# Patient Record
Sex: Male | Born: 1995 | Race: White | Hispanic: No | Marital: Single | Smoking: Former smoker
Health system: Southern US, Community
[De-identification: ages and names within clinical notes are randomized; demographics above are authoritative.]

## PROBLEM LIST (undated history)

## (undated) DIAGNOSIS — F909 Attention-deficit hyperactivity disorder, unspecified type: Secondary | ICD-10-CM

## (undated) DIAGNOSIS — K219 Gastro-esophageal reflux disease without esophagitis: Secondary | ICD-10-CM

## (undated) HISTORY — PX: KNEE SURGERY: SHX244

## (undated) HISTORY — PX: ABDOMINAL SURGERY: SHX537

---
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
## 2013-02-21 ENCOUNTER — Emergency Department (HOSPITAL_COMMUNITY)
Admission: EM | Admit: 2013-02-21 | Discharge: 2013-02-21 | Disposition: A | Discharge status: Home / Self Care | Payer: Worker's Compensation | Attending: Emergency Medicine | Admitting: Emergency Medicine

## 2013-02-21 ENCOUNTER — Encounter (HOSPITAL_COMMUNITY): Payer: Self-pay | Admitting: Emergency Medicine

## 2013-02-21 DIAGNOSIS — W268XXA Contact with other sharp object(s), not elsewhere classified, initial encounter: Secondary | ICD-10-CM | POA: Insufficient documentation

## 2013-02-21 DIAGNOSIS — Z8659 Personal history of other mental and behavioral disorders: Secondary | ICD-10-CM | POA: Insufficient documentation

## 2013-02-21 DIAGNOSIS — Z23 Encounter for immunization: Secondary | ICD-10-CM | POA: Insufficient documentation

## 2013-02-21 DIAGNOSIS — Y93G1 Activity, food preparation and clean up: Secondary | ICD-10-CM | POA: Insufficient documentation

## 2013-02-21 DIAGNOSIS — S61219A Laceration without foreign body of unspecified finger without damage to nail, initial encounter: Secondary | ICD-10-CM

## 2013-02-21 DIAGNOSIS — Y990 Civilian activity done for income or pay: Secondary | ICD-10-CM | POA: Insufficient documentation

## 2013-02-21 DIAGNOSIS — S61209A Unspecified open wound of unspecified finger without damage to nail, initial encounter: Secondary | ICD-10-CM | POA: Insufficient documentation

## 2013-02-21 DIAGNOSIS — F17200 Nicotine dependence, unspecified, uncomplicated: Secondary | ICD-10-CM | POA: Insufficient documentation

## 2013-02-21 DIAGNOSIS — Y9229 Other specified public building as the place of occurrence of the external cause: Secondary | ICD-10-CM | POA: Insufficient documentation

## 2013-02-21 MED ORDER — TETANUS-DIPHTH-ACELL PERTUSSIS 5-2.5-18.5 LF-MCG/0.5 IM SUSP
0.5000 mL | Freq: Once | INTRAMUSCULAR | Status: AC
  Administered 2013-02-21: 0.5 mL via INTRAMUSCULAR
  Filled 2013-02-21: qty 0.5

## 2013-02-21 NOTE — Discharge Instructions (Signed)
Please read and follow all provided instructions.  Your diagnoses today include:  1. Finger laceration     Tests performed today include:  Vital signs. See below for your results today.   Medications prescribed:   None  Take any prescribed medications only as directed.   Home care instructions:  Follow any educational materials and wound care instructions contained in this packet.   Keep affected area above the level of your heart when possible to minimize swelling. Wash area gently twice a day with warm soapy water. Do not apply alcohol or hydrogen peroxide. Cover the area if it draining or weeping.   Follow-up instructions: Suture Removal: Return to the Emergency Department or see your primary care care doctor in 7 days for a recheck of your wound and removal of your sutures or staples.    If you do not have a primary care doctor -- see below for referral information.   Return instructions:  Return to the Emergency Department if you have:  Fever  Worsening pain  Worsening swelling of the wound  Pus draining from the wound  Redness of the skin that moves away from the wound, especially if it streaks away from the affected area   Any other emergent concerns  Your vital signs today were: BP 141/75   Pulse 68   Temp(Src) 98.4 F (36.9 C) (Oral)   Resp 18   SpO2 100% If your blood pressure (BP) was elevated above 135/85 this visit, please have this repeated by your doctor within one month. --------------

## 2013-02-21 NOTE — ED Provider Notes (Signed)
CSN: [id number]     Arrival date & time 02/21/13  1208 History  This chart was scribed for non-physician practitioner, Renne CriglerJoshua Joee Iovine, PA-C, working with Shelda JakesScott W. Zackowski, MD by Marica OtterNusrat Rahman, ED Scribe. This patient was seen in room TR09C/TR09C and the patient's care was started at 12:10 PM.  Chief Complaint  Patient presents with  . Laceration   The history is provided by the patient. No language interpreter was used.    HPI Comments: Omar Sheppard is a 18 y.o. male who presents to the Emergency Department complaining of a laceration to the right fifth finger onset 30-35 minutes ago. 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 Pt reports that his coworkers poured salt on his wound then he rinsed it with water and wrapped it in a towel to help control the bleeding. Pt further complains of a stinging sensation at the laceration site. There is no numbness or weakness in hand. He denies any other symptoms. Tetanus is not up to date. He is a current smoker.   Past Medical History  Diagnosis Date  . ADHD (attention deficit hyperactivity disorder)    Past Surgical History  Procedure Laterality Date  . Knee surgery    . Abdominal surgery      when infant   No family history on file. History  Substance Use Topics  . Smoking status: Current Some Day Smoker    Types: Cigars  . Smokeless tobacco: Not on file  . Alcohol Use: Yes     Comment: occasionally    Review of Systems  Gastrointestinal: Negative for nausea and vomiting.  Skin: Positive for wound (laceration on right hand, fifth digit).  Neurological: Negative for weakness and numbness.     Allergies  Review of patient's allergies indicates no known allergies.  Home Medications   Current Outpatient Rx  Name  Route  Sig  Dispense  Refill  . albuterol (PROVENTIL HFA;VENTOLIN HFA) 108 (90 BASE) MCG/ACT inhaler    Inhalation   Inhale 1-2 puffs into the lungs every 6 (six) hours as needed for wheezing.   1 Inhaler   0     Triage Vitals:   BP 141/75  Pulse 68  Temp(Src) 98.4 F (36.9 C) (Oral)  Resp 18  SpO2 100% Physical Exam  Nursing note and vitals reviewed. Constitutional: He appears well-developed and well-nourished. No distress.  HENT:  Head: Normocephalic and atraumatic.  Eyes: EOM are normal.  Neck: Neck supple. No tracheal deviation present.  Cardiovascular: Normal rate.   Pulses:      Radial pulses are 2+ on the right side.  Pulmonary/Chest: Effort normal. No respiratory distress.  Musculoskeletal: Normal range of motion.       Right hand: He exhibits laceration. He exhibits normal range of motion, normal capillary refill and no deformity. Normal sensation noted. Normal strength noted.  Normal capillary refill in right hand. Normal range of motion of right hand. 1 cm laceration to the ulnar aspect of the right fifth digit overlying the PIP joint. Upon exploration of wound, found to extend into subcutaneous tissue only and there is no tendon or nerve involvement visible. No FB seen or palpated.   Neurological: He is alert.  Motor, sensation, and vascular distal to injury fully intact.  Skin: Skin is warm and dry.  Psychiatric: He has a normal mood and affect. His behavior is normal.  ED Course  Procedures (including critical care time) DIAGNOSTIC STUDIES: Oxygen Saturation is 100% on room air, normal by my interpretation.    COORDINATION OF CARE:  12:11 PM-Discussed treatment plan and patient verbalizes understanding and agrees.  12:21 PM- Performed suture repair to laceration. LACERATION REPAIR PROCEDURE NOTE The patient's identification was confirmed and consent was obtained. This procedure was performed by Renne Crigler, PA-C at 12:21 PM. Site: right fifth digit Sterile procedures observed Anesthetic used (type and amt): 1% lidocaine wo epi, 1 cc Suture type/size:  6-0 ethilon Length: 1 cm # of Sutures: 4 Technique: Simple, interrupted Complexity: Simple Antibx ointment applied Tetanus ordered Site anesthetized, irrigated with NS, explored without evidence of foreign body, wound well approximated, site covered with dry, sterile dressing.  Patient tolerated procedure well without complications. Instructions for care discussed verbally and patient provided with additional written instructions for homecare and f/u.  Vital signs reviewed and are as follows: Filed Vitals:   02/21/13 1211  BP: 141/75  Pulse: 68  Temp: 98.4 F (36.9 C)  Resp: 18    Patient counseled on wound care. Patient counseled on need to return or see PCP/urgent care for suture removal in 7 days. Patient was urged to return to the Emergency Department urgently with worsening pain, swelling, expanding erythema especially if it streaks away from the affected area, fever, or if they have any other concerns. Patient verbalized understanding.    MDM   Final diagnoses:  Finger laceration   Finger lac, superficial, repaired without complication. Do not suspect bony, tendon, vascular involvement. Full ROM of finger.   I personally performed the services described in this documentation, which was scribed in my presence. The recorded information has been reviewed and is accurate.     Renne Crigler, PA-C 02/21/13 1304

## 2013-02-21 NOTE — ED Notes (Signed)
Pt presents with laceration to R 5th finger - reports wire mesh strand cut finger at work.

## 2013-02-25 NOTE — ED Provider Notes (Signed)
Medical screening examination/treatment/procedure(s) were performed by non-physician practitioner and as supervising physician I was immediately available for consultation/collaboration.  EKG Interpretation   None         Salma Walrond W. Yazeed Pryer, MD 02/25/13 0851 

## 2014-02-20 ENCOUNTER — Inpatient Hospital Stay (HOSPITAL_COMMUNITY)
Admission: EM | Admit: 2014-02-20 | Discharge: 2014-02-28 | DRG: 604 | Disposition: A | Discharge status: Home / Self Care | Payer: Medicaid Other | Attending: Internal Medicine | Admitting: Internal Medicine

## 2014-02-20 ENCOUNTER — Emergency Department (HOSPITAL_COMMUNITY): Payer: Medicaid Other

## 2014-02-20 ENCOUNTER — Encounter (HOSPITAL_COMMUNITY): Payer: Self-pay | Admitting: *Deleted

## 2014-02-20 DIAGNOSIS — K2960 Other gastritis without bleeding: Secondary | ICD-10-CM | POA: Diagnosis present

## 2014-02-20 DIAGNOSIS — R109 Unspecified abdominal pain: Secondary | ICD-10-CM | POA: Diagnosis present

## 2014-02-20 DIAGNOSIS — N170 Acute kidney failure with tubular necrosis: Secondary | ICD-10-CM | POA: Diagnosis present

## 2014-02-20 DIAGNOSIS — R112 Nausea with vomiting, unspecified: Secondary | ICD-10-CM | POA: Diagnosis present

## 2014-02-20 DIAGNOSIS — S301XXA Contusion of abdominal wall, initial encounter: Principal | ICD-10-CM | POA: Diagnosis present

## 2014-02-20 DIAGNOSIS — A419 Sepsis, unspecified organism: Secondary | ICD-10-CM | POA: Diagnosis present

## 2014-02-20 DIAGNOSIS — F17210 Nicotine dependence, cigarettes, uncomplicated: Secondary | ICD-10-CM | POA: Diagnosis present

## 2014-02-20 DIAGNOSIS — R1900 Intra-abdominal and pelvic swelling, mass and lump, unspecified site: Secondary | ICD-10-CM

## 2014-02-20 DIAGNOSIS — D62 Acute posthemorrhagic anemia: Secondary | ICD-10-CM | POA: Diagnosis not present

## 2014-02-20 DIAGNOSIS — R55 Syncope and collapse: Secondary | ICD-10-CM | POA: Diagnosis present

## 2014-02-20 DIAGNOSIS — K449 Diaphragmatic hernia without obstruction or gangrene: Secondary | ICD-10-CM | POA: Diagnosis present

## 2014-02-20 DIAGNOSIS — E869 Volume depletion, unspecified: Secondary | ICD-10-CM | POA: Diagnosis present

## 2014-02-20 DIAGNOSIS — X58XXXA Exposure to other specified factors, initial encounter: Secondary | ICD-10-CM | POA: Diagnosis present

## 2014-02-20 DIAGNOSIS — R001 Bradycardia, unspecified: Secondary | ICD-10-CM | POA: Diagnosis present

## 2014-02-20 DIAGNOSIS — R200 Anesthesia of skin: Secondary | ICD-10-CM

## 2014-02-20 DIAGNOSIS — Z23 Encounter for immunization: Secondary | ICD-10-CM

## 2014-02-20 DIAGNOSIS — K661 Hemoperitoneum: Secondary | ICD-10-CM | POA: Diagnosis present

## 2014-02-20 DIAGNOSIS — R195 Other fecal abnormalities: Secondary | ICD-10-CM

## 2014-02-20 DIAGNOSIS — R1010 Upper abdominal pain, unspecified: Secondary | ICD-10-CM | POA: Diagnosis present

## 2014-02-20 DIAGNOSIS — F909 Attention-deficit hyperactivity disorder, unspecified type: Secondary | ICD-10-CM | POA: Diagnosis present

## 2014-02-20 DIAGNOSIS — I639 Cerebral infarction, unspecified: Secondary | ICD-10-CM

## 2014-02-20 DIAGNOSIS — N179 Acute kidney failure, unspecified: Secondary | ICD-10-CM

## 2014-02-20 DIAGNOSIS — R1902 Left upper quadrant abdominal swelling, mass and lump: Secondary | ICD-10-CM | POA: Insufficient documentation

## 2014-02-20 DIAGNOSIS — D509 Iron deficiency anemia, unspecified: Secondary | ICD-10-CM | POA: Diagnosis present

## 2014-02-20 DIAGNOSIS — I509 Heart failure, unspecified: Secondary | ICD-10-CM

## 2014-02-20 DIAGNOSIS — K209 Esophagitis, unspecified: Secondary | ICD-10-CM | POA: Diagnosis present

## 2014-02-20 DIAGNOSIS — R188 Other ascites: Secondary | ICD-10-CM | POA: Diagnosis present

## 2014-02-20 DIAGNOSIS — E876 Hypokalemia: Secondary | ICD-10-CM | POA: Diagnosis present

## 2014-02-20 DIAGNOSIS — R202 Paresthesia of skin: Secondary | ICD-10-CM

## 2014-02-20 DIAGNOSIS — Z8249 Family history of ischemic heart disease and other diseases of the circulatory system: Secondary | ICD-10-CM | POA: Diagnosis not present

## 2014-02-20 LAB — COMPREHENSIVE METABOLIC PANEL
ALBUMIN: 4.4 g/dL (ref 3.5–5.2)
ALK PHOS: 97 U/L (ref 39–117)
ALT: 27 U/L (ref 0–53)
ANION GAP: 8 (ref 5–15)
AST: 27 U/L (ref 0–37)
BUN: 14 mg/dL (ref 6–23)
CO2: 24 mmol/L (ref 19–32)
Calcium: 9.3 mg/dL (ref 8.4–10.5)
Chloride: 104 mmol/L (ref 96–112)
Creatinine, Ser: 0.88 mg/dL (ref 0.50–1.35)
GFR calc Af Amer: >90 mL/min (ref >90)
GFR calc non Af Amer: >90 mL/min (ref >90)
Glucose, Bld: 167 mg/dL — ABNORMAL HIGH (ref 70–99)
POTASSIUM: 3.9 mmol/L (ref 3.5–5.1)
Sodium: 136 mmol/L (ref 135–145)
TOTAL PROTEIN: 7.1 g/dL (ref 6.0–8.3)
Total Bilirubin: 0.4 mg/dL (ref 0.3–1.2)

## 2014-02-20 LAB — URINALYSIS, ROUTINE W REFLEX MICROSCOPIC
Bilirubin Urine: NEGATIVE
GLUCOSE, UA: NEGATIVE mg/dL
Hgb urine dipstick: NEGATIVE
Ketones, ur: NEGATIVE mg/dL
LEUKOCYTES UA: NEGATIVE
Nitrite: NEGATIVE
Protein, ur: NEGATIVE mg/dL
Specific Gravity, Urine: >1.046 — ABNORMAL HIGH (ref 1.005–1.030)
UROBILINOGEN UA: 0.2 mg/dL (ref 0.0–1.0)
pH: 6.5 (ref 5.0–8.0)

## 2014-02-20 LAB — CBC
HCT: 32.9 % — ABNORMAL LOW (ref 39.0–52.0)
HEMOGLOBIN: 11.1 g/dL — AB (ref 13.0–17.0)
MCH: 30.5 pg (ref 26.0–34.0)
MCHC: 33.7 g/dL (ref 30.0–36.0)
MCV: 90.4 fL (ref 78.0–100.0)
Platelets: 248 10*3/uL (ref 150–400)
RBC: 3.64 MIL/uL — AB (ref 4.22–5.81)
RDW: 13.2 % (ref 11.5–15.5)
WBC: 19.4 10*3/uL — ABNORMAL HIGH (ref 4.0–10.5)

## 2014-02-20 LAB — CBC WITH DIFFERENTIAL/PLATELET
BASOS ABS: 0 10*3/uL (ref 0.0–0.1)
BASOS PCT: 0 % (ref 0–1)
EOS ABS: 0.2 10*3/uL (ref 0.0–0.7)
Eosinophils Relative: 1 % (ref 0–5)
HCT: 41.4 % (ref 39.0–52.0)
HEMOGLOBIN: 14.1 g/dL (ref 13.0–17.0)
Lymphocytes Relative: 12 % (ref 12–46)
Lymphs Abs: 2 10*3/uL (ref 0.7–4.0)
MCH: 31.1 pg (ref 26.0–34.0)
MCHC: 34.1 g/dL (ref 30.0–36.0)
MCV: 91.2 fL (ref 78.0–100.0)
MONOS PCT: 6 % (ref 3–12)
Monocytes Absolute: 1 10*3/uL (ref 0.1–1.0)
NEUTROS PCT: 81 % — AB (ref 43–77)
Neutro Abs: 13.3 10*3/uL — ABNORMAL HIGH (ref 1.7–7.7)
Platelets: 282 10*3/uL (ref 150–400)
RBC: 4.54 MIL/uL (ref 4.22–5.81)
RDW: 13.4 % (ref 11.5–15.5)
WBC: 16.5 10*3/uL — ABNORMAL HIGH (ref 4.0–10.5)

## 2014-02-20 LAB — RAPID URINE DRUG SCREEN, HOSP PERFORMED
Amphetamines: NOT DETECTED
BARBITURATES: NOT DETECTED
BENZODIAZEPINES: NOT DETECTED
Cocaine: NOT DETECTED
OPIATES: POSITIVE — AB
Tetrahydrocannabinol: POSITIVE — AB

## 2014-02-20 LAB — PROCALCITONIN

## 2014-02-20 LAB — LACTIC ACID, PLASMA: LACTIC ACID, VENOUS: 2.1 mmol/L — AB (ref 0.5–2.0)

## 2014-02-20 LAB — LIPASE, BLOOD: Lipase: 28 U/L (ref 11–59)

## 2014-02-20 LAB — MRSA PCR SCREENING: MRSA by PCR: NEGATIVE

## 2014-02-20 LAB — ETHANOL: Alcohol, Ethyl (B): <5 mg/dL (ref 0–9)

## 2014-02-20 LAB — BRAIN NATRIURETIC PEPTIDE: B Natriuretic Peptide: 31.5 pg/mL (ref 0.0–100.0)

## 2014-02-20 MED ORDER — SODIUM CHLORIDE 0.9 % IV BOLUS (SEPSIS)
1000.0000 mL | Freq: Once | INTRAVENOUS | Status: AC
  Administered 2014-02-20: 1000 mL via INTRAVENOUS

## 2014-02-20 MED ORDER — ACETAMINOPHEN 325 MG PO TABS
650.0000 mg | ORAL_TABLET | Freq: Four times a day (QID) | ORAL | Status: DC | PRN
  Administered 2014-02-22 – 2014-02-23 (×2): 650 mg via ORAL
  Filled 2014-02-20 (×3): qty 2

## 2014-02-20 MED ORDER — INFLUENZA VAC SPLIT QUAD 0.5 ML IM SUSY
0.5000 mL | PREFILLED_SYRINGE | INTRAMUSCULAR | Status: AC
  Administered 2014-02-21: 0.5 mL via INTRAMUSCULAR
  Filled 2014-02-20 (×2): qty 0.5

## 2014-02-20 MED ORDER — PIPERACILLIN-TAZOBACTAM 3.375 G IVPB
3.3750 g | Freq: Three times a day (TID) | INTRAVENOUS | Status: DC
  Administered 2014-02-21 – 2014-02-25 (×15): 3.375 g via INTRAVENOUS
  Filled 2014-02-20 (×15): qty 50

## 2014-02-20 MED ORDER — IOHEXOL 300 MG/ML  SOLN
100.0000 mL | Freq: Once | INTRAMUSCULAR | Status: AC | PRN
  Administered 2014-02-20: 100 mL via INTRAVENOUS

## 2014-02-20 MED ORDER — SODIUM CHLORIDE 0.9 % IV SOLN
INTRAVENOUS | Status: DC
Start: 2014-02-20 — End: 2014-02-20

## 2014-02-20 MED ORDER — HYDROMORPHONE HCL 1 MG/ML IJ SOLN
1.0000 mg | INTRAMUSCULAR | Status: DC | PRN
  Administered 2014-02-20 – 2014-02-21 (×4): 1 mg via INTRAVENOUS
  Filled 2014-02-20 (×4): qty 1

## 2014-02-20 MED ORDER — ONDANSETRON HCL 4 MG/2ML IJ SOLN
4.0000 mg | Freq: Four times a day (QID) | INTRAMUSCULAR | Status: DC | PRN
  Administered 2014-02-20 – 2014-02-24 (×6): 4 mg via INTRAVENOUS
  Filled 2014-02-20 (×7): qty 2

## 2014-02-20 MED ORDER — PIPERACILLIN-TAZOBACTAM 3.375 G IVPB 30 MIN
3.3750 g | INTRAVENOUS | Status: AC
  Administered 2014-02-20: 3.375 g via INTRAVENOUS
  Filled 2014-02-20: qty 50

## 2014-02-20 MED ORDER — ONDANSETRON HCL 4 MG/2ML IJ SOLN
4.0000 mg | Freq: Once | INTRAMUSCULAR | Status: AC
  Administered 2014-02-20: 4 mg via INTRAVENOUS
  Filled 2014-02-20: qty 2

## 2014-02-20 MED ORDER — ACETAMINOPHEN 650 MG RE SUPP: 650.0000 mg | Freq: Four times a day (QID) | RECTAL | Status: DC | PRN

## 2014-02-20 MED ORDER — MORPHINE SULFATE 2 MG/ML IJ SOLN
1.0000 mg | INTRAMUSCULAR | Status: DC | PRN
  Administered 2014-02-20 – 2014-02-21 (×2): 1 mg via INTRAVENOUS
  Filled 2014-02-20 (×2): qty 1

## 2014-02-20 MED ORDER — ONDANSETRON HCL 4 MG PO TABS: 4.0000 mg | ORAL_TABLET | Freq: Four times a day (QID) | ORAL | Status: DC | PRN

## 2014-02-20 MED ORDER — VANCOMYCIN HCL IN DEXTROSE 1-5 GM/200ML-% IV SOLN
1000.0000 mg | INTRAVENOUS | Status: AC
  Administered 2014-02-20: 1000 mg via INTRAVENOUS
  Filled 2014-02-20: qty 200

## 2014-02-20 MED ORDER — SODIUM CHLORIDE 0.9 % IV SOLN
INTRAVENOUS | Status: AC
  Administered 2014-02-20 (×2): via INTRAVENOUS

## 2014-02-20 MED ORDER — IOHEXOL 300 MG/ML  SOLN
50.0000 mL | Freq: Once | INTRAMUSCULAR | Status: AC | PRN
  Administered 2014-02-20: 50 mL via ORAL

## 2014-02-20 MED ORDER — VANCOMYCIN HCL IN DEXTROSE 1-5 GM/200ML-% IV SOLN
1000.0000 mg | Freq: Three times a day (TID) | INTRAVENOUS | Status: DC
  Administered 2014-02-21 – 2014-02-23 (×7): 1000 mg via INTRAVENOUS
  Filled 2014-02-20 (×8): qty 200

## 2014-02-20 MED ORDER — HYDROMORPHONE HCL 1 MG/ML IJ SOLN
1.0000 mg | Freq: Once | INTRAMUSCULAR | Status: AC
Start: 2014-02-20 — End: 2014-02-20
  Administered 2014-02-20: 1 mg via INTRAVENOUS
  Filled 2014-02-20: qty 1

## 2014-02-20 NOTE — ED Notes (Signed)
Pt reports dry heaving since 0830. Pt reports when he was vomiting, he felt a "pop" in his chest and has been having pain since then, points to substernal area as location of pain. Denies diarrhea. Nausea not constant, only right before a dry heaving episode.

## 2014-02-20 NOTE — ED Notes (Signed)
Pt reports feeling much better. Denies nausea. Pain 5/10. No longer diaphoretic, pale, lethargic. Family at bedside. Awaiting surgery consult.

## 2014-02-20 NOTE — ED Notes (Signed)
Pt reports a dry heaving episode this am, which he has been having for the last few months which he relates to his girlfriend being pregnant. Sts the main problem today is that he felt a pop in his chest when he was dryheaving that is now causing him severe pain. Friend out to desk demanding staff in room stating pt was passing out. Pt noted to be sitting in chair beside bed, alert and oriented but had to be instructed multiple times to move to the bed. Pt never observed to actually pass out. Pt slightly diaphoretic lying in bed.

## 2014-02-20 NOTE — ED Notes (Signed)
[facility]. Matt, RN transporting pt to [place].

## 2014-02-20 NOTE — ED Notes (Signed)
Xray completed at bedside

## 2014-02-20 NOTE — Progress Notes (Signed)
ANTIBIOTIC CONSULT NOTE - INITIAL  Pharmacy Consult for Vancomycin and Zosyn Indication: rule out sepsis  No Known Allergies  Patient Measurements: Height: 5\' 6"  (167.6 cm) Weight: 170 lb (77.111 kg) IBW/kg (Calculated) : 63.8  Vital Signs: Temp: 97.5 F (36.4 C) (02/19 1420) Temp Source: Oral (02/19 1420) BP: 129/57 mmHg (02/19 1726) Pulse Rate: 98 (02/19 1726) Intake/Output from previous day:   Intake/Output from this shift:    Labs:  Recent Labs  02/20/14 1049  WBC 16.5*  HGB 14.1  PLT 282  CREATININE 0.88   Estimated Creatinine Clearance: 130.9 mL/min (by C-G formula based on Cr of 0.88). No results for input(s): VANCOTROUGH, VANCOPEAK, VANCORANDOM, GENTTROUGH, GENTPEAK, GENTRANDOM, TOBRATROUGH, TOBRAPEAK, TOBRARND, AMIKACINPEAK, AMIKACINTROU, AMIKACIN in the last 72 hours.   Microbiology: No results found for this or any previous visit (from the past 720 hour(s)).  Medical History: Past Medical History  Diagnosis Date  . ADHD (attention deficit hyperactivity disorder)     Medications:  Scheduled:   Infusions:  . sodium chloride    . piperacillin-tazobactam    . [START ON 02/21/2014] piperacillin-tazobactam (ZOSYN)  IV    . vancomycin    . [START ON 02/21/2014] vancomycin     PRN: acetaminophen **OR** acetaminophen, morphine injection, ondansetron **OR** ondansetron (ZOFRAN) IV  Assessment: 19 yo male presents with dry heaves, epigastric and lower chest pain, and syncopal episode in the ED. Abdominal CT reveals a 15 cm abdominal mass of uncertain etiology. [facility] MD has consulted Pharmacy to dose vancomycin and zosyn for sepsis as patient with hypotermia, hypotension, tachycardia and tachypnea .   Goal of Therapy:  Vancomycin trough level 15-20 mcg/ml  Zosyn dose per indication/renal function  Plan:   Vancomycin 1g IV q8h Check trough at steady state Zosyn 3.375gm IV q8h (4hr extended infusions) Follow up renal function & cultures, clinical  course  Loralee PacasErin Timiya Howells, PharmD, BCPS Pager: [phone] 02/20/2014,5:34 PM

## 2014-02-20 NOTE — ED Notes (Signed)
Pt assigned to 5E med-surg bed. Pt having bradycardic episodes and syncope. Elisabeth Pigeonevine, MD made aware and bed assignment changed to stepdown.

## 2014-02-20 NOTE — ED Provider Notes (Signed)
Date: 02/20/2014  Rate: 90  Rhythm: normal sinus rhythm  QRS Axis: normal  Intervals: normal  ST/T Wave abnormalities: normal  Conduction Disutrbances: none  Narrative Interpretation: unremarkable  I did not evaluate this patient and had no interaction with him     Doug SouSam Jaquel Coomer, MD 02/20/14 1812

## 2014-02-20 NOTE — H&P (Addendum)
[facility] History and Physical  Omar Sheppard [facility] BWG:[id number] DOB: 05/13/1995 DOA: 02/20/2014  Referring physician: ER physician PCP: [name], MD   Chief Complaint: abdominal pain, nausea   HPI:  19 y.o. Male with no significant past medical history who presented to [facility] ED with worsening sharp, constant abdominal pain in upper abdomen and epigastric area started on the day prior to this admission associated with nausea, 8/10 in intensity and occasionally radiating to chest area. Pt reported no palpitations but feels short of breath if he lays down. No reports of vomiting. No reports of diarrhea or constipation. No blood per rectum. No syncope, no loss of consciousness. No fevers or chills. No vision changes. No urinary complaints.   In ED, BP was 87/61, HR 49-102, RR 27, T 95.2 F and oxygen saturation 99% on room air. Blood work showed WBC count 16.5 otherwise unremarkable. CT abdomen showed 15.3 cm soft tissue lesion in the left upper abdomen, worrisome for tumor such as lymphoma or possibly sarcoma. Surgery has seen the pt in consultation. In addition, pt had a brief episode of bradycardia and syncope in ED and order placed for SDU admission. Since vitals signs and blood work suggestive of sepsis pt was started on broad spectrum abx.   Assessment & Plan    Principal Problem:   Abdominal mass, nausea and abdominal pain - CT abdomen showed 15.3 cm soft tissue lesion in the left upper abdomen, worrisome for tumor such as lymphoma or possibly sarcoma. - order placed for IR consult for possible percutaneous biopsy - continue supportive care with IV fluids, analgesia and antiemetics as needed  - surgery consulted as well  Active Problems:   Syncope / Bradycardia - unclear etiology; obtain UDS, ethanol level - obtain 12 lead EKG, cycle cardiac enzymes  - monitor in SDU - may need cardio consult depending on 12 lead EKG shows, if patient continues to have  symptomatic bradycardia or if elevated trop levels    Sepsis - sepsis criteria met on admission with hypotermia, hypotension (87/61), tachycardia and tachypnea. In addition, pt has leukocytosis. CXR did not show evidence of infection. UA not ordered in ED so will obtain UA and urine culture, procalcitonin and lactic acid. Follow up blood culture results. - started empiric vanco and zosyn, will narrow down or discontinue as soon as above results are back  - admission to SDU - continue IV fluids    DVT prophylaxis:  - SCD's bilaterally    Radiological Exams on Admission: Ct Abdomen Pelvis W Contrast 02/20/2014 15.3 cm soft tissue lesion in the left upper abdomen, worrisome for tumor such as lymphoma or possibly sarcoma. Hematoma is considered less likely in the absence of trauma.  Associated moderate complex/ high density abdominopelvic ascites. No frank peritoneal nodularity.  No free air per  Given this patient has a history of significant trauma, consider surgical consultation. If biopsy is desired, this is likely amenable via endoscopic ultrasound or percutaneously.  These results were called by telephone at the time of interpretation on 02/20/2014 at 2:20 pm to Dr. Quintella Reichert , who verbally acknowledged these results.   Electronically Signed   By: Julian Hy M.D.   On: 02/20/2014 14:26   Dg Chest Port 1 View 02/20/2014  No edema or consolidation.   Electronically Signed   By: Lowella Grip III M.D.   On: 02/20/2014 11:11    EKG: pending   Code Status: Full Family Communication: Plan of care discussed with the patient  Disposition Plan: Admit for further evaluation. SDU due to hypotension, bradycardia, syncope   [name], MD  [facility] Pager [phone]  Review of Systems:  Constitutional: Negative for fever, chills and malaise/fatigue. Negative for diaphoresis.  HENT: Negative for hearing loss, ear pain, nosebleeds, congestion, sore throat, neck pain, tinnitus  and ear discharge.   Eyes: Negative for blurred vision, double vision, photophobia, pain, discharge and redness.  Respiratory: Negative for cough, hemoptysis, sputum production, shortness of breath, wheezing and stridor.   Cardiovascular: positive for chest pain, orthopnea, no claudication and no leg swelling.  Gastrointestinal: per HPI.  Genitourinary: Negative for dysuria, urgency, frequency, hematuria and flank pain.  Musculoskeletal: Negative for myalgias, back pain, joint pain and falls.  Skin: Negative for itching and rash.  Neurological: Negative for dizziness and weakness. Negative for tingling, tremors, sensory change, speech change, focal weakness, loss of consciousness and headaches.  Endo/Heme/Allergies: Negative for environmental allergies and polydipsia. Does not bruise/bleed easily.  Psychiatric/Behavioral: Negative for suicidal ideas. The patient is not nervous/anxious.      Past Medical History  Diagnosis Date  . ADHD (attention deficit hyperactivity disorder)    Past Surgical History  Procedure Laterality Date  . Knee surgery    . Abdominal surgery      when infant   Social History:  reports that he has been smoking Cigars.  He does not have any smokeless tobacco history on file. He reports that he drinks alcohol. He reports that he uses illicit drugs (Marijuana).  No Known Allergies  Family History: HTN in family    Prior to Admission medications   Not on File   Physical Exam: Filed Vitals:   02/20/14 1545 02/20/14 1600 02/20/14 1612 02/20/14 1630  BP: 113/72 115/63 93/35 130/69  Pulse: 101 102 77 80  Temp:      TempSrc:      Resp: 17 22 16 22   SpO2: 100% 100% 100% 100%    Physical Exam  Constitutional: Appears ill, pale. No distress.  HENT: Normocephalic. No tonsillar erythema or exudates Eyes: Conjunctivae are pale, EOM are normal. PERRLA, no scleral icterus.  Neck: Normal ROM. Neck supple. No JVD. No tracheal deviation. No thyromegaly.  CVS: RRR,  S1/S2 +, no murmurs, no gallops, no carotid bruit.  Pulmonary: Effort and breath sounds normal, no stridor, rhonchi, wheezes, rales.  Abdominal: Soft. BS +,  no distension, tenderness, rebound or guarding.  Musculoskeletal: Normal range of motion. No edema and no tenderness.  Lymphadenopathy: No lymphadenopathy noted, cervical, inguinal. Neuro: Alert. Normal reflexes, muscle tone coordination. No focal neurologic deficits. Skin: Skin is warm and dry. No rash noted.  No erythema. No pallor.  Psychiatric: Normal mood and affect. Behavior, judgment, thought content normal.   Labs on Admission:  Basic Metabolic Panel:  Recent Labs Lab 02/20/14 1049  NA 136  K 3.9  CL 104  CO2 24  GLUCOSE 167*  BUN 14  CREATININE 0.88  CALCIUM 9.3   Liver Function Tests:  Recent Labs Lab 02/20/14 1049  AST 27  ALT 27  ALKPHOS 97  BILITOT 0.4  PROT 7.1  ALBUMIN 4.4    Recent Labs Lab 02/20/14 1049  LIPASE 28   No results for input(s): AMMONIA in the last 168 hours. CBC:  Recent Labs Lab 02/20/14 1049  WBC 16.5*  NEUTROABS 13.3*  HGB 14.1  HCT 41.4  MCV 91.2  PLT 282   Cardiac Enzymes: No results for input(s): CKTOTAL, CKMB, CKMBINDEX, TROPONINI in the last 168 hours.  BNP: Invalid input(s): POCBNP CBG: No results for input(s): GLUCAP in the last 168 hours.  If 7PM-7AM, please contact night-coverage [email or web address] Password [facility] 02/20/2014, 4:40 PM

## 2014-02-20 NOTE — Progress Notes (Signed)
CT scan results reviewed. A biopsy is needed to establish the diagnosis. This can be done percutaneous or open (Via surgery).  Once the final pathology is available, please contact Medical Oncology at that point.

## 2014-02-20 NOTE — ED Notes (Signed)
Surgery PA at bedside.  

## 2014-02-20 NOTE — ED Provider Notes (Addendum)
CSN: [id number]     Arrival date & time 02/20/14  1006 History   First MD Initiated Contact with Patient 02/20/14 1017     Chief Complaint  Patient presents with  . Abdominal Pain  . Emesis    Patient is a 19 y.o. male presenting with abdominal pain and vomiting. The history is provided by the patient. No language interpreter was used.  Abdominal Pain Associated symptoms: vomiting   Emesis Associated symptoms: abdominal pain    Omar Sheppard presents for the evaluation of abdominal pain. He awoke this morning and dry heaved at 8 AM and developed epigastric/lower chest pain. The pain began while when he was dry heaving. He felt a pop when the pain began.  The pain is severe and sharp and constant. It is nonradiating. He feels a little short of breath. The pain is bad he felt like he might pass out. He denies any diarrhea, abdominal pain, dysuria. He is a moderate drinker and drinks 5 beers yesterday, which is normal for him.  Past Medical History  Diagnosis Date  . ADHD (attention deficit hyperactivity disorder)    Past Surgical History  Procedure Laterality Date  . Knee surgery    . Abdominal surgery      when infant   No family history on file. History  Substance Use Topics  . Smoking status: Current Some Day Smoker    Types: Cigars  . Smokeless tobacco: Not on file  . Alcohol Use: Yes     Comment: occasionally    Review of Systems  Gastrointestinal: Positive for vomiting and abdominal pain.  All other systems reviewed and are negative.     Allergies  Review of patient's allergies indicates no known allergies.  Home Medications   Prior to Admission medications   Not on File   BP 106/53 mmHg  Pulse 71  Resp 20  SpO2 100% Physical Exam  Constitutional: He is oriented to person, place, and time. He appears well-developed and well-nourished. He appears distressed.  HENT:  Head: Normocephalic and atraumatic.  Cardiovascular: Normal rate and regular rhythm.   No  murmur heard. Pulmonary/Chest: Effort normal and breath sounds normal. No respiratory distress.  Abdominal: Soft. There is no rebound and no guarding.  Moderate epigastric tenderness  Musculoskeletal: He exhibits no edema or tenderness.  Neurological: He is alert and oriented to person, place, and time.  Skin: Skin is warm and dry.  Psychiatric: He has a normal mood and affect. His behavior is normal.  Nursing note and vitals reviewed.   ED Course  Procedures (including critical care time) Labs Review Labs Reviewed  COMPREHENSIVE METABOLIC PANEL - Abnormal; Notable for the following:    Glucose, Bld 167 (*)    All other components within normal limits  CBC WITH DIFFERENTIAL/PLATELET - Abnormal; Notable for the following:    WBC 16.5 (*)    Neutrophils Relative % 81 (*)    Neutro Abs 13.3 (*)    All other components within normal limits  LIPASE, BLOOD    Imaging Review Ct Abdomen Pelvis W Contrast  02/20/2014   CLINICAL DATA:  Abdominal pain, nausea/ vomiting  EXAM: CT ABDOMEN AND PELVIS WITH CONTRAST  TECHNIQUE: Multidetector CT imaging of the abdomen and pelvis was performed using the standard protocol following bolus administration of intravenous contrast.  CONTRAST:  100mL OMNIPAQUE IOHEXOL 300 MG/ML  SOLN  COMPARISON:  None.  FINDINGS: Lower chest:  Lung bases are clear.  Hepatobiliary: Liver is within normal limits.  Gallbladder is unremarkable. No intrahepatic or extrahepatic ductal dilatation.  Pancreas: Within normal limits.  Spleen: Within normal limits.  Adrenals/Urinary Tract: Adrenal glands are unremarkable.  Kidneys are within normal limits.  No hydronephrosis.  Bladder is within normal limits  Stomach/Bowel: Greater curvature of the stomach is medially displaced but otherwise within normal limits.  No evidence of bowel obstruction.  Normal appendix.  Vascular/Lymphatic: No evidence of abdominal aortic aneurysm.  No suspicious abdominopelvic lymphadenopathy.  Reproductive:  Prostate is unremarkable.  Other: 15.3 x 6.9 x 11.0 cm macrolobulated soft tissue lesion in the left upper abdomen (series 2/ image 22; coronal image 32) which displaces the stomach medially in the spleen posteriorly. This appearance is worrisome for tumor such as lymphoma or possibly sarcoma. Hematoma is considered less likely in the absence of trauma.  Moderate abdominopelvic ascites, measuring higher than simple fluid density. No frank peritoneal nodularity.  No free air.  Musculoskeletal: Visualized osseous structures are within normal limits.  IMPRESSION: 15.3 cm soft tissue lesion in the left upper abdomen, worrisome for tumor such as lymphoma or possibly sarcoma. Hematoma is considered less likely in the absence of trauma.  Associated moderate complex/ high density abdominopelvic ascites. No frank peritoneal nodularity.  No free air per  Given this patient has a history of significant trauma, consider surgical consultation. If biopsy is desired, this is likely amenable via endoscopic ultrasound or percutaneously.  These results were called by telephone at the time of interpretation on 02/20/2014 at 2:20 pm to Dr. Tilden Fossa , who verbally acknowledged these results.   Electronically Signed   By: Charline Bills M.D.   On: 02/20/2014 14:26   Dg Chest Port 1 View  02/20/2014   CLINICAL DATA:  2 hr history of chest pain and shortness of breath  EXAM: PORTABLE CHEST - 1 VIEW  COMPARISON:  November 19, 2012  FINDINGS: Lungs are clear. Heart size and pulmonary vascularity are normal. No adenopathy. No pneumothorax. No bone lesions.  IMPRESSION: No edema or consolidation.   Electronically Signed   By: Bretta Bang III M.D.   On: 02/20/2014 11:11     EKG Interpretation None      MDM   Final diagnoses:  Abdominal mass, left upper quadrant    Patient here for evaluation of abdominal pain. CT demonstrates abdominal mass with free fluid. Patient does not have a history of abdominal trauma.  Hemoglobin is stable for patient. Discussed with patient findings of CT scan. Gen. surgery consultation for evaluation, recommends hospitalist consultation for admission.    D/w Dr. Elisabeth Pigeon - recommends Oncology consult.  D/w Dr. Clelia Croft with Oncology.     Tilden Fossa, MD 02/20/14 1518  Tilden Fossa, MD 02/20/14 1535

## 2014-02-20 NOTE — ED Notes (Signed)
Another syncopal episode witnessed by Clydie BraunKaren, NT phleb. Pt became diaphoretic, eyes rolled back in his head, unresponsive, approx 5-10 sec. Back to baseline shortly after. Pt c/o pain. BP soft, fluid initiated, will reassess BP in 15 min.

## 2014-02-20 NOTE — ED Notes (Signed)
Attempted to transport pt at 1850 to SD. Elisabeth Pigeonevine, MD to bedside requesting to speak with pt before he is transferred. Due to shift change now, will transport pt shortly after ED shift change.

## 2014-02-20 NOTE — ED Notes (Signed)
Pt in room crying stating it is hard to breathe because of pain now. Medicated with 1mg  morphine at 1840, no relief. Elisabeth Pigeonevine, MD paged. Awaiting response.

## 2014-02-20 NOTE — ED Notes (Addendum)
Unable to medicate pt at this time due to hypotension. Repeat still soft. Will bolus and reassess. Pt and visitors aware of this and verbalize understanding.

## 2014-02-20 NOTE — Consult Note (Signed)
Reason for Consult:  15 cm soft tissue mass Left upper abdomen Referring Physician: Dr. Quintella Reichert  Omar Sheppard is an 19 y.o. male.  HPI: 19 y/o who awoke with dry heaves, followed by epigastric and lower chest pain.  He was sitting in the bathroom on the toilet this AM and felt like he had to vomit.  He felt a  pop during the dry heaves; he reports developing a sharpe, severe pain that was constant. He felt like he might pass out and came to the ED.  He has had similar feelings for some time, long standing history of early AM dry heaves, most recently related it to his girlfriend being pregnant.   He has never had a syncopal episode before today.   He had another presyncopal episode here in the hospital waiting area. He did become hypotensive and was treated for this. He had another episode and may have passed out in the ED here today while Dr. Lucia Gaskins was talking with him.  He says he feels fine now, but gets SOB/feels dyspneic lying flat.  He is tender over LUQ currently.  He is very scared.   He had pyloric stenosis surgery as an infant.  He has not history of abdominal or GI disease.   Past Medical History  Diagnosis Date  . ADHD (attention deficit hyperactivity disorder)     Past Surgical History  Procedure Laterality Date  . Knee surgery    . Abdominal surgery      when infant    No family history on file.  Social History:  reports that he has been smoking Cigars.  He does not have any smokeless tobacco history on file. He reports that he drinks alcohol. He reports that he uses illicit drugs (Marijuana). ETOH:  A couple beers per day is normal.  Drug:  Occasional [place] Tobacco:  Chews at times  Allergies: No Known Allergies  Prior to Admission medications   Not on File    Anti-infectives    None      Results for orders placed or performed during the hospital encounter of 02/20/14 (from the past 48 hour(s))  Comprehensive metabolic panel     Status:  Abnormal   Collection Time: 02/20/14 10:49 AM  Result Value Ref Range   Sodium 136 135 - 145 mmol/L   Potassium 3.9 3.5 - 5.1 mmol/L   Chloride 104 96 - 112 mmol/L   CO2 24 19 - 32 mmol/L   Glucose, Bld 167 (H) 70 - 99 mg/dL   BUN 14 6 - 23 mg/dL   Creatinine, Ser 0.88 0.50 - 1.35 mg/dL   Calcium 9.3 8.4 - 10.5 mg/dL   Total Protein 7.1 6.0 - 8.3 g/dL   Albumin 4.4 3.5 - 5.2 g/dL   AST 27 0 - 37 U/L   ALT 27 0 - 53 U/L   Alkaline Phosphatase 97 39 - 117 U/L   Total Bilirubin 0.4 0.3 - 1.2 mg/dL   GFR calc non Af Amer >90 >90 mL/min   GFR calc Af Amer >90 >90 mL/min    Comment: (NOTE) The eGFR has been calculated using the CKD EPI equation. This calculation has not been validated in all clinical situations. eGFR's persistently <90 mL/min signify possible Chronic Kidney Disease.    Anion gap 8 5 - 15  Lipase, blood     Status: None   Collection Time: 02/20/14 10:49 AM  Result Value Ref Range   Lipase 28 11 - 59  U/L  CBC with Differential     Status: Abnormal   Collection Time: 02/20/14 10:49 AM  Result Value Ref Range   WBC 16.5 (H) 4.0 - 10.5 K/uL   RBC 4.54 4.22 - 5.81 MIL/uL   Hemoglobin 14.1 13.0 - 17.0 g/dL   HCT 41.4 39.0 - 52.0 %   MCV 91.2 78.0 - 100.0 fL   MCH 31.1 26.0 - 34.0 pg   MCHC 34.1 30.0 - 36.0 g/dL   RDW 13.4 11.5 - 15.5 %   Platelets 282 150 - 400 K/uL   Neutrophils Relative % 81 (H) 43 - 77 %   Neutro Abs 13.3 (H) 1.7 - 7.7 K/uL   Lymphocytes Relative 12 12 - 46 %   Lymphs Abs 2.0 0.7 - 4.0 K/uL   Monocytes Relative 6 3 - 12 %   Monocytes Absolute 1.0 0.1 - 1.0 K/uL   Eosinophils Relative 1 0 - 5 %   Eosinophils Absolute 0.2 0.0 - 0.7 K/uL   Basophils Relative 0 0 - 1 %   Basophils Absolute 0.0 0.0 - 0.1 K/uL    Ct Abdomen Pelvis W Contrast  02/20/2014   CLINICAL DATA:  Abdominal pain, nausea/ vomiting  EXAM: CT ABDOMEN AND PELVIS WITH CONTRAST  TECHNIQUE: Multidetector CT imaging of the abdomen and pelvis was performed using the standard  protocol following bolus administration of intravenous contrast.  CONTRAST:  130m OMNIPAQUE IOHEXOL 300 MG/ML  SOLN  COMPARISON:  None.  FINDINGS: Lower chest:  Lung bases are clear.  Hepatobiliary: Liver is within normal limits.  Gallbladder is unremarkable. No intrahepatic or extrahepatic ductal dilatation.  Pancreas: Within normal limits.  Spleen: Within normal limits.  Adrenals/Urinary Tract: Adrenal glands are unremarkable.  Kidneys are within normal limits.  No hydronephrosis.  Bladder is within normal limits  Stomach/Bowel: Greater curvature of the stomach is medially displaced but otherwise within normal limits.  No evidence of bowel obstruction.  Normal appendix.  Vascular/Lymphatic: No evidence of abdominal aortic aneurysm.  No suspicious abdominopelvic lymphadenopathy.  Reproductive: Prostate is unremarkable.  Other: 15.3 x 6.9 x 11.0 cm macrolobulated soft tissue lesion in the left upper abdomen (series 2/ image 22; coronal image 32) which displaces the stomach medially in the spleen posteriorly. This appearance is worrisome for tumor such as lymphoma or possibly sarcoma. Hematoma is considered less likely in the absence of trauma.  Moderate abdominopelvic ascites, measuring higher than simple fluid density. No frank peritoneal nodularity.  No free air.  Musculoskeletal: Visualized osseous structures are within normal limits.  IMPRESSION: 15.3 cm soft tissue lesion in the left upper abdomen, worrisome for tumor such as lymphoma or possibly sarcoma. Hematoma is considered less likely in the absence of trauma.  Associated moderate complex/ high density abdominopelvic ascites. No frank peritoneal nodularity.  No free air per  Given this patient has a history of significant trauma, consider surgical consultation. If biopsy is desired, this is likely amenable via endoscopic ultrasound or percutaneously.  These results were called by telephone at the time of interpretation on 02/20/2014 at 2:20 pm to Dr.  EQuintella Reichert, who verbally acknowledged these results.   Electronically Signed   By: SJulian HyM.D.   On: 02/20/2014 14:26   Dg Chest Port 1 View  02/20/2014   CLINICAL DATA:  2 hr history of chest pain and shortness of breath  EXAM: PORTABLE CHEST - 1 VIEW  COMPARISON:  November 19, 2012  FINDINGS: Lungs are clear. Heart size and pulmonary vascularity  are normal. No adenopathy. No pneumothorax. No bone lesions.  IMPRESSION: No edema or consolidation.   Electronically Signed   By: Lowella Grip III M.D.   On: 02/20/2014 11:11  hematemesis    Review of Systems  Constitutional: Positive for weight loss (he says weight comes and goes, depending on his work and activity level.).       He has had a couple syncopal episodes today.  Some presyncope before this.  HENT: Negative.   Eyes: Negative.   Respiratory: Negative.   Cardiovascular: Positive for orthopnea (he says he is SOB lying flat today, this is new.).  Gastrointestinal: Positive for heartburn, nausea, vomiting (occasionally, he had one episode of hematemesis about 1 year ago.  ) and abdominal pain. Negative for diarrhea, constipation, blood in stool and melena.  Genitourinary: Negative.   Musculoskeletal: Negative.   Skin: Negative.        Multiple tatoo's No nodal changes. He is aware of.  Neurological: Negative.   Endo/Heme/Allergies: Negative.   Psychiatric/Behavioral: Negative.    Blood pressure 102/47, pulse 90, temperature 97.5 F (36.4 C), temperature source Oral, resp. rate 27, SpO2 100 %. Physical Exam  Constitutional: He is oriented to person, place, and time. He appears well-developed and well-nourished. He appears distressed (very anxiouos and had another sycopal episode whild Dr. Lucia Gaskins was in room.).  HENT:  Head: Normocephalic and atraumatic.  Eyes: Conjunctivae and EOM are normal. Pupils are equal, round, and reactive to light. Right eye exhibits no discharge. Left eye exhibits no discharge. No scleral  icterus.  Neck: Normal range of motion. Neck supple. No JVD present. No tracheal deviation present. No thyromegaly present.  Cardiovascular: Normal rate, regular rhythm, normal heart sounds and intact distal pulses.   No murmur heard. Respiratory: Effort normal and breath sounds normal. No respiratory distress. He has no wheezes. He has no rales. He exhibits no tenderness.  GI: Soft. Bowel sounds are normal. He exhibits no distension and no mass. There is tenderness (some in the LUQ). There is no rebound and no guarding.  Genitourinary:  No masses in groin or testicles, (testicular exam by Dr. Lucia Gaskins)  Musculoskeletal: He exhibits no edema or tenderness.  Lymphadenopathy:    He has no cervical adenopathy.  Neurological: He is alert and oriented to person, place, and time. No cranial nerve deficit.  Skin: Skin is warm and dry. No rash noted. No erythema. No pallor.  Psychiatric: He has a normal mood and affect. His behavior is normal. Judgment and thought content normal.  He is really scared.  He has a child on the way.     Assessment/Plan: 1.  15 cm LUQ abdominal mass of uncertain etiology 2.   Leukocytosis 3.   Hx of ADHD 4.   Syncope in the ED 5.   Prior abdominal surgery for pyloric stenosis as an infant.  Plan:  Medicine will admit.   I will talked with IR, and they cannot do it tomorrow because Pathology is not available.  We will order repeat CBC just to be sure these episodes are not from some type of bleed.  Pt denies any trauma. We will follow with you.     JENNINGS,WILLARD 02/20/2014, 3:21 PM   Agree with above. Large LUQ mass of unclear etiology.  Will need pathology to make surgical/medial decisions. I showed the patient and his parents the CT scan in Epic to give them an idea of what is going on in his abdomen. I spent 20 minutes going  over the findings with the family.  Alphonsa Overall, MD, [facility] Surgery Pager: [phone] Office phone:  [phone]

## 2014-02-20 NOTE — ED Notes (Signed)
Pt episode of feeling nausea and diaphoretic; pt then rolls eye in back of head and head slumps forward. Campos at bedside immediately post event. Pt VS stable at present time. Pt alert and oriented x4 post episode.

## 2014-02-20 NOTE — ED Notes (Signed)
[facility]

## 2014-02-20 NOTE — ED Notes (Signed)
With [facility] attending, Dr. Ezzard StandingNewman, and admission RN at bedside, pt became diaphoretic, unresponsive, bradycardia to 50, hypotension. RN in to room, NS bolus initiated per Dr. Ezzard StandingNewman. Pt responsive to voice after RN's arrival. EKG unable to capture bradycardia. Will continue to monitor.

## 2014-02-20 NOTE — ED Notes (Signed)
While drawing labs pt stated he starting sweating and pt put his head back stating he was going to pass out.  I immediately withdrew needle, notified nurse and rechk'd v/s

## 2014-02-20 NOTE — ED Notes (Signed)
Pt's SO encouraging pt to drink CT contrast. Pt appears more comfortable but still lethargic. Will continue to monitor.

## 2014-02-21 ENCOUNTER — Encounter (HOSPITAL_COMMUNITY): Payer: Self-pay | Admitting: Radiology

## 2014-02-21 ENCOUNTER — Inpatient Hospital Stay (HOSPITAL_COMMUNITY): Payer: Medicaid Other

## 2014-02-21 LAB — GLUCOSE, CAPILLARY: Glucose-Capillary: 109 mg/dL — ABNORMAL HIGH (ref 70–99)

## 2014-02-21 LAB — TROPONIN I: Troponin I: <0.03 ng/mL (ref <0.031)

## 2014-02-21 MED ORDER — MORPHINE SULFATE 2 MG/ML IJ SOLN
1.0000 mg | INTRAMUSCULAR | Status: DC | PRN
  Administered 2014-02-21: 1 mg via INTRAVENOUS
  Filled 2014-02-21: qty 1

## 2014-02-21 MED ORDER — SODIUM CHLORIDE 0.9 % IV SOLN
INTRAVENOUS | Status: DC
  Administered 2014-02-22 – 2014-02-25 (×6): via INTRAVENOUS

## 2014-02-21 MED ORDER — HYDROMORPHONE HCL 1 MG/ML IJ SOLN
0.5000 mg | INTRAMUSCULAR | Status: DC | PRN
  Administered 2014-02-21 – 2014-02-22 (×5): 0.5 mg via INTRAVENOUS
  Filled 2014-02-21 (×5): qty 1

## 2014-02-21 MED ORDER — MORPHINE SULFATE 2 MG/ML IJ SOLN: 1.0000 mg | INTRAMUSCULAR | Status: DC | PRN

## 2014-02-21 MED ORDER — KETOROLAC TROMETHAMINE 15 MG/ML IJ SOLN
15.0000 mg | Freq: Four times a day (QID) | INTRAMUSCULAR | Status: DC | PRN
  Administered 2014-02-21 – 2014-02-22 (×2): 15 mg via INTRAVENOUS
  Filled 2014-02-21 (×2): qty 1

## 2014-02-21 NOTE — Progress Notes (Signed)
[facility] PROGRESS NOTE  Omar Sheppard ZOX:[id number] DOB: 16-Nov-1995 DOA: 02/20/2014 PCP: Cresenciano Lick, MD  Assessment/Plan: 1. Abdominal mass with abd pain 1. 15.3cm L upper abd mass worrisome for tumor noted on imaging 2. General Surgery following, recs for IR guided biopsy 3. Pt seen by IR with plans for biopsy on 2/22 4. Will decrease frequency of narcotics as pt seems very sleepy this AM, likely from narctoics given q3hrs overnight 5. Pt is without grimacing and is nontender when abd was palpated this AM 6. Will add toradol for mild/mod pain and reserve dilaudid for more severe pain 7. Renal function is normal and no listed allergies to toradol/nsaids 2. SIRS 1. Unclear etiology 2. WBC is now 19.4k 3. Urine cx pending 4. Blood cultures thus far w/o growth, still pending final results 5. Pt is on empiric zosyn. Will continue for now 3. Syncope/bradycardia 1. Pt more sleepy this AM 2. Question if secondary to narcotics that were given Q3hrs overnight 3. Decrease frequency of narcotics per above 4. DVT prophylaxis 1. SCD's  Code Status: Full Family Communication: Pt in room (indicate person spoken with, relationship, and if by phone, the number) Disposition Plan: Pending   Consultants:  General Surgery  IR  Procedures:    Antibiotics:  Zosyn 2/19>>> (indicate start date, and stop date if known)  HPI/Subjective: Pt without complaints this AM. Noted to be more sleepy  Objective: Filed Vitals:   02/21/14 0600 02/21/14 0800 02/21/14 1158 02/21/14 1200  BP: 125/41 124/45 140/58 128/64  Pulse: 89 96 81 94  Temp:  97.8 F (36.6 C)  98.2 F (36.8 C)  TempSrc:  Oral  Oral  Resp: Height:      Weight:      SpO2: 96% 99% 100% 99%    Intake/Output Summary (Last 24 hours) at 02/21/14 1510 Last data filed at 02/21/14 1400  Gross per 24 hour  Intake   1940 ml  Output    500 ml  Net   1440 ml   Filed Weights   02/20/14 1730  02/20/14 1945 02/21/14 0500  Weight: 77.111 kg (170 lb) 76.6 kg (168 lb 14 oz) 79 kg (174 lb 2.6 oz)    Exam:   General:  Awake, in nad  Cardiovascular: regular, s1, s2  Respiratory: normal resp effort, no wheezing  Abdomen: soft, nondistended  Musculoskeletal: perfused, no clubbing   Data Reviewed: Basic Metabolic Panel:  Recent Labs Lab 02/20/14 1049  NA 136  K 3.9  CL 104  CO2 24  GLUCOSE 167*  BUN 14  CREATININE 0.88  CALCIUM 9.3   Liver Function Tests:  Recent Labs Lab 02/20/14 1049  AST 27  ALT 27  ALKPHOS 97  BILITOT 0.4  PROT 7.1  ALBUMIN 4.4    Recent Labs Lab 02/20/14 1049  LIPASE 28   No results for input(s): AMMONIA in the last 168 hours. CBC:  Recent Labs Lab 02/20/14 1049 02/20/14 1725  WBC 16.5* 19.4*  NEUTROABS 13.3*  --   HGB 14.1 11.1*  HCT 41.4 32.9*  MCV 91.2 90.4  PLT 282 248   Cardiac Enzymes:  Recent Labs Lab 02/20/14 2340 02/21/14 0538  TROPONINI <0.03 <0.03   BNP (last 3 results)  Recent Labs  02/20/14 1743  BNP 31.5    ProBNP (last 3 results) No results for input(s): PROBNP in the last 8760 hours.  CBG: No results for input(s): GLUCAP in the last 168 hours.  Recent Results (  from the past 240 hour(s))  Culture, blood (routine x 2)     Status: None (Preliminary result)   Collection Time: 02/20/14  6:06 PM  Result Value Ref Range Status   Specimen Description BLOOD RIGHT HAND  5 [facility]  Final   Special Requests NONE  Final   Culture   Final           BLOOD CULTURE RECEIVED NO GROWTH TO DATE CULTURE WILL BE HELD FOR 5 DAYS BEFORE ISSUING A FINAL NEGATIVE REPORT [facility]    Report Status PENDING  Incomplete  Culture, blood (routine x 2)     Status: None (Preliminary result)   Collection Time: 02/20/14  6:17 PM  Result Value Ref Range Status   Specimen Description BLOOD LEFT HAND  3 [facility]  [facility]OTTLE  Final   Special Requests NONE  Final   Culture   Final            BLOOD CULTURE RECEIVED NO GROWTH TO DATE CULTURE WILL BE HELD FOR 5 DAYS BEFORE ISSUING A FINAL NEGATIVE REPORT [facility]formed at [facility]    Report Status PENDING  Incomplete  MRSA PCR Screening     Status: None   Collection Time: 02/20/14  7:57 PM  Result Value Ref Range Status   MRSA by PCR NEGATIVE NEGATIVE Final    Comment:        The GeneXpert MRSA Assay (FDA approved for NASAL specimens only), is one component of a comprehensive MRSA colonization surveillance program. It is not intended to diagnose MRSA infection nor to guide or monitor treatment for MRSA infections.      Studies: Ct Head Wo Contrast  02/21/2014   CLINICAL DATA:  Facial numbness.  EXAM: CT HEAD WITHOUT CONTRAST  TECHNIQUE: Contiguous axial images were obtained from the base of the skull through the vertex without intravenous contrast.  COMPARISON:  None.  FINDINGS: The ventricles are normal in size and configuration. No extra-axial fluid collections are identified. The gray-white differentiation is normal. No CT findings for acute intracranial process such as hemorrhage or infarction. No mass lesions. The brainstem and cerebellum are grossly normal.  The bony structures are intact. The paranasal sinuses and mastoid air cells are clear. The globes are intact.  IMPRESSION: Normal head CT.   Electronically Signed   By: Rudie MeyerP.  Gallerani M.D.   On: 02/21/2014 08:13   Ct Abdomen Pelvis W Contrast  02/20/2014   CLINICAL DATA:  Abdominal pain, nausea/ vomiting  EXAM: CT ABDOMEN AND PELVIS WITH CONTRAST  TECHNIQUE: Multidetector CT imaging of the abdomen and pelvis was performed using the standard protocol following bolus administration of intravenous contrast.  CONTRAST:  100mL OMNIPAQUE IOHEXOL 300 MG/ML  SOLN  COMPARISON:  None.  FINDINGS: Lower chest:  Lung bases are clear.  Hepatobiliary: Liver is within normal limits.  Gallbladder is unremarkable. No intrahepatic or extrahepatic ductal dilatation.  Pancreas:  Within normal limits.  Spleen: Within normal limits.  Adrenals/Urinary Tract: Adrenal glands are unremarkable.  Kidneys are within normal limits.  No hydronephrosis.  Bladder is within normal limits  Stomach/Bowel: Greater curvature of the stomach is medially displaced but otherwise within normal limits.  No evidence of bowel obstruction.  Normal appendix.  Vascular/Lymphatic: No evidence of abdominal aortic aneurysm.  No suspicious abdominopelvic lymphadenopathy.  Reproductive: Prostate is unremarkable.  Other: 15.3 x 6.9 x 11.0 cm macrolobulated soft tissue lesion in the left upper abdomen (series 2/ image 22;  coronal image 32) which displaces the stomach medially in the spleen posteriorly. This appearance is worrisome for tumor such as lymphoma or possibly sarcoma. Hematoma is considered less likely in the absence of trauma.  Moderate abdominopelvic ascites, measuring higher than simple fluid density. No frank peritoneal nodularity.  No free air.  Musculoskeletal: Visualized osseous structures are within normal limits.  IMPRESSION: 15.3 cm soft tissue lesion in the left upper abdomen, worrisome for tumor such as lymphoma or possibly sarcoma. Hematoma is considered less likely in the absence of trauma.  Associated moderate complex/ high density abdominopelvic ascites. No frank peritoneal nodularity.  No free air per  Given this patient has a history of significant trauma, consider surgical consultation. If biopsy is desired, this is likely amenable via endoscopic ultrasound or percutaneously.  These results were called by telephone at the time of interpretation on 02/20/2014 at 2:20 pm to Dr. Tilden Fossa , who verbally acknowledged these results.   Electronically Signed   By: Charline Bills M.D.   On: 02/20/2014 14:26   Dg Chest Port 1 View  02/20/2014   CLINICAL DATA:  2 hr history of chest pain and shortness of breath  EXAM: PORTABLE CHEST - 1 VIEW  COMPARISON:  November 19, 2012  FINDINGS: Lungs are  clear. Heart size and pulmonary vascularity are normal. No adenopathy. No pneumothorax. No bone lesions.  IMPRESSION: No edema or consolidation.   Electronically Signed   By: Bretta Bang III M.D.   On: 02/20/2014 11:11    Scheduled Meds: . piperacillin-tazobactam (ZOSYN)  IV  3.375 g Intravenous Q8H  . vancomycin  1,000 mg Intravenous Q8H   Continuous Infusions: . sodium chloride 75 mL/hr at 02/20/14 2030    Principal Problem:   Abdominal mass Active Problems:   Syncope   Bradycardia   Sepsis   CHIU, STEPHEN K  [facility] Pager [phone]. If 7PM-7AM, please contact night-coverage at [email or web address], password [facility] 02/21/2014, 3:10 PM  LOS: 1 day

## 2014-02-21 NOTE — Progress Notes (Signed)
Abdominal mass  Subjective: Pt with some neurologic symptoms overnight prompting head CT.  CT normal.    Objective: Vital signs in last 24 hours: Temp:  [95.2 F (35.1 C)-98.2 F (36.8 C)] 97.8 F (36.6 C) (02/20 0800) Pulse Rate:  [49-109] 96 (02/20 0800) Resp:  [12-27] 12 (02/20 0600) BP: (87-151)/(31-75) 124/45 mmHg (02/20 0800) SpO2:  [96 %-100 %] 99 % (02/20 0800) Weight:  [168 lb 14 oz (76.6 kg)-174 lb 2.6 oz (79 kg)] 174 lb 2.6 oz (79 kg) (02/20 0500) Last BM Date: 02/20/14  Intake/Output from previous day: 02/19 0701 - 02/20 0700 In: 1000 [I.V.:750; IV Piggyback:250] Out: 500 [Urine:500] Intake/Output this shift: Total I/O In: 75 [I.V.:75] Out: -   General appearance: alert and cooperative GI: soft  Lab Results:  Results for orders placed or performed during the hospital encounter of 02/20/14 (from the past 24 hour(s))  Comprehensive metabolic panel     Status: Abnormal   Collection Time: 02/20/14 10:49 AM  Result Value Ref Range   Sodium 136 135 - 145 mmol/L   Potassium 3.9 3.5 - 5.1 mmol/L   Chloride 104 96 - 112 mmol/L   CO2 24 19 - 32 mmol/L   Glucose, Bld 167 (H) 70 - 99 mg/dL   BUN 14 6 - 23 mg/dL   Creatinine, Ser 0.860.88 0.50 - 1.35 mg/dL   Calcium 9.3 8.4 - 57.810.5 mg/dL   Total Protein 7.1 6.0 - 8.3 g/dL   Albumin 4.4 3.5 - 5.2 g/dL   AST 27 0 - 37 U/L   ALT 27 0 - 53 U/L   Alkaline Phosphatase 97 39 - 117 U/L   Total Bilirubin 0.4 0.3 - 1.2 mg/dL   GFR calc non Af Amer >90 >90 mL/min   GFR calc Af Amer >90 >90 mL/min   Anion gap 8 5 - 15  Lipase, blood     Status: None   Collection Time: 02/20/14 10:49 AM  Result Value Ref Range   Lipase 28 11 - 59 U/L  CBC with Differential     Status: Abnormal   Collection Time: 02/20/14 10:49 AM  Result Value Ref Range   WBC 16.5 (H) 4.0 - 10.5 K/uL   RBC 4.54 4.22 - 5.81 MIL/uL   Hemoglobin 14.1 13.0 - 17.0 g/dL   HCT 46.941.4 62.939.0 - 52.852.0 %   MCV 91.2 78.0 - 100.0 fL   MCH 31.1 26.0 - 34.0 pg   MCHC 34.1  30.0 - 36.0 g/dL   RDW 41.313.4 24.411.5 - 01.015.5 %   Platelets 282 150 - 400 K/uL   Neutrophils Relative % 81 (H) 43 - 77 %   Neutro Abs 13.3 (H) 1.7 - 7.7 K/uL   Lymphocytes Relative 12 12 - 46 %   Lymphs Abs 2.0 0.7 - 4.0 K/uL   Monocytes Relative 6 3 - 12 %   Monocytes Absolute 1.0 0.1 - 1.0 K/uL   Eosinophils Relative 1 0 - 5 %   Eosinophils Absolute 0.2 0.0 - 0.7 K/uL   Basophils Relative 0 0 - 1 %   Basophils Absolute 0.0 0.0 - 0.1 K/uL  CBC     Status: Abnormal   Collection Time: 02/20/14  5:25 PM  Result Value Ref Range   WBC 19.4 (H) 4.0 - 10.5 K/uL   RBC 3.64 (L) 4.22 - 5.81 MIL/uL   Hemoglobin 11.1 (L) 13.0 - 17.0 g/dL   HCT 27.232.9 (L) 53.639.0 - 64.452.0 %   MCV 90.4 78.0 -  100.0 fL   MCH 30.5 26.0 - 34.0 pg   MCHC 33.7 30.0 - 36.0 g/dL   RDW 40.9 81.1 - 91.4 %   Platelets 248 150 - 400 K/uL  Lactic acid, plasma     Status: Abnormal   Collection Time: 02/20/14  5:26 PM  Result Value Ref Range   Lactic Acid, Venous 2.1 (HH) 0.5 - 2.0 mmol/L  Procalcitonin - Baseline     Status: None   Collection Time: 02/20/14  5:26 PM  Result Value Ref Range   Procalcitonin <0.10 ng/mL  Ethanol     Status: None   Collection Time: 02/20/14  5:35 PM  Result Value Ref Range   Alcohol, Ethyl (B) <5 0 - 9 mg/dL  Brain natriuretic peptide     Status: None   Collection Time: 02/20/14  5:43 PM  Result Value Ref Range   B Natriuretic Peptide 31.5 0.0 - 100.0 pg/mL  Urinalysis, Routine w reflex microscopic     Status: Abnormal   Collection Time: 02/20/14  5:47 PM  Result Value Ref Range   Color, Urine YELLOW YELLOW   APPearance CLEAR CLEAR   Specific Gravity, Urine >1.046 (H) 1.005 - 1.030   pH 6.5 5.0 - 8.0   Glucose, UA NEGATIVE NEGATIVE mg/dL   Hgb urine dipstick NEGATIVE NEGATIVE   Bilirubin Urine NEGATIVE NEGATIVE   Ketones, ur NEGATIVE NEGATIVE mg/dL   Protein, ur NEGATIVE NEGATIVE mg/dL   Urobilinogen, UA 0.2 0.0 - 1.0 mg/dL   Nitrite NEGATIVE NEGATIVE   Leukocytes, UA NEGATIVE NEGATIVE   Urine rapid drug screen (hosp performed)     Status: Abnormal   Collection Time: 02/20/14  5:48 PM  Result Value Ref Range   Opiates POSITIVE (A) NONE DETECTED   Cocaine NONE DETECTED NONE DETECTED   Benzodiazepines NONE DETECTED NONE DETECTED   Amphetamines NONE DETECTED NONE DETECTED   Tetrahydrocannabinol POSITIVE (A) NONE DETECTED   Barbiturates NONE DETECTED NONE DETECTED  Culture, blood (routine x 2)     Status: None (Preliminary result)   Collection Time: 02/20/14  6:06 PM  Result Value Ref Range   Specimen Description BLOOD RIGHT HAND  5 ML IN EACH BOTTLE    Special Requests NONE    Culture             BLOOD CULTURE RECEIVED NO GROWTH TO DATE CULTURE WILL BE HELD FOR 5 DAYS BEFORE ISSUING A FINAL NEGATIVE REPORT Performed at [facility]    Report Status PENDING   Culture, blood (routine x 2)     Status: None (Preliminary result)   Collection Time: 02/20/14  6:17 PM  Result Value Ref Range   Specimen Description BLOOD LEFT HAND  3 ML IN EACH  BOTTLE    Special Requests NONE    Culture             BLOOD CULTURE RECEIVED NO GROWTH TO DATE CULTURE WILL BE HELD FOR 5 DAYS BEFORE ISSUING A FINAL NEGATIVE REPORT Performed at [facility]    Report Status PENDING   MRSA PCR Screening     Status: None   Collection Time: 02/20/14  7:57 PM  Result Value Ref Range   MRSA by PCR NEGATIVE NEGATIVE  Troponin I (q 6hr x 3)     Status: None   Collection Time: 02/20/14 11:40 PM  Result Value Ref Range   Troponin I <0.03 <0.031 ng/mL  Troponin I (q 6hr x 3)     Status: None  Collection Time: 02/21/14  5:38 AM  Result Value Ref Range   Troponin I <0.03 <0.031 ng/mL     Studies/Results Radiology     MEDS, Scheduled . Influenza vac split quadrivalent PF  0.5 mL Intramuscular Tomorrow-1000  . piperacillin-tazobactam (ZOSYN)  IV  3.375 g Intravenous Q8H  . vancomycin  1,000 mg Intravenous Q8H     Assessment: Abdominal mass   Plan: Will await biopsy by  IR on Mon and continued medical workup.   Will see as needed over the rest of the weekend.    LOS: 1 day    Vanita Panda, MD [facility], [place] [phone]   02/21/2014 9:00 AM

## 2014-02-21 NOTE — Progress Notes (Signed)
Agree with previous RN's assessment. Will continue to monitor patient. Setzer, Nevin Grizzle Marie  

## 2014-02-21 NOTE — Progress Notes (Signed)
Patient with slight droop of Left side of mouth.  Patient c/o numbness of mouth.  Notified doctor and CT of head ordered.  CT of head done at 0815.  Numbness better, no droop noted.

## 2014-02-21 NOTE — Progress Notes (Signed)
Referring Physician(s): Dr. Clelia CroftShadad, Dr. Elisabeth Pigeonevine  Subjective: 19 yo male admitted with abd pain, N/V and found to have large LUQ mass IR requested to obtain tissue biopsy. Imaging, Chart, PMHx, meds, labs reviewed.  Past Medical History  Diagnosis Date  . ADHD (attention deficit hyperactivity disorder)    Past Surgical History  Procedure Laterality Date  . Knee surgery    . Abdominal surgery      when infant   History   Social History  . Marital Status: Single    Spouse Name: N/A  . Number of Children: N/A  . Years of Education: N/A   Occupational History  . Not on file.   Social History Main Topics  . Smoking status: Current Some Day Smoker    Types: Cigars  . Smokeless tobacco: Not on file  . Alcohol Use: Yes     Comment: occasionally  . Drug Use: Yes    Special: Marijuana     Comment: once a month  . Sexual Activity: Not on file   Other Topics Concern  . Not on file   Social History Narrative    Allergies: Review of patient's allergies indicates no known allergies.  Medications: Prior to Admission medications   Not on File    Review of Systems  Vital Signs: BP 124/45 mmHg  Pulse 96  Temp(Src) 97.8 F (36.6 C) (Oral)  Resp 12  Ht 5\' 6"  (1.676 m)  Wt 174 lb 2.6 oz (79 kg)  BMI 28.12 kg/m2  SpO2 99%  Physical Exam General: sleepy, but NAD ENT: unremarkable airway Lungs: CTA without w/r/r Heart: Regular Abdomen: nontender, soft  Imaging: Ct Head Wo Contrast  02/21/2014   CLINICAL DATA:  Facial numbness.  EXAM: CT HEAD WITHOUT CONTRAST  TECHNIQUE: Contiguous axial images were obtained from the base of the skull through the vertex without intravenous contrast.  COMPARISON:  None.  FINDINGS: The ventricles are normal in size and configuration. No extra-axial fluid collections are identified. The gray-white differentiation is normal. No CT findings for acute intracranial process such as hemorrhage or infarction. No mass lesions. The brainstem  and cerebellum are grossly normal.  The bony structures are intact. The paranasal sinuses and mastoid air cells are clear. The globes are intact.  IMPRESSION: Normal head CT.   Electronically Signed   By: Rudie MeyerP.  Gallerani M.D.   On: 02/21/2014 08:13   Ct Abdomen Pelvis W Contrast  02/20/2014   CLINICAL DATA:  Abdominal pain, nausea/ vomiting  EXAM: CT ABDOMEN AND PELVIS WITH CONTRAST  TECHNIQUE: Multidetector CT imaging of the abdomen and pelvis was performed using the standard protocol following bolus administration of intravenous contrast.  CONTRAST:  100mL OMNIPAQUE IOHEXOL 300 MG/ML  SOLN  COMPARISON:  None.  FINDINGS: Lower chest:  Lung bases are clear.  Hepatobiliary: Liver is within normal limits.  Gallbladder is unremarkable. No intrahepatic or extrahepatic ductal dilatation.  Pancreas: Within normal limits.  Spleen: Within normal limits.  Adrenals/Urinary Tract: Adrenal glands are unremarkable.  Kidneys are within normal limits.  No hydronephrosis.  Bladder is within normal limits  Stomach/Bowel: Greater curvature of the stomach is medially displaced but otherwise within normal limits.  No evidence of bowel obstruction.  Normal appendix.  Vascular/Lymphatic: No evidence of abdominal aortic aneurysm.  No suspicious abdominopelvic lymphadenopathy.  Reproductive: Prostate is unremarkable.  Other: 15.3 x 6.9 x 11.0 cm macrolobulated soft tissue lesion in the left upper abdomen (series 2/ image 22; coronal image 32) which displaces the stomach medially  in the spleen posteriorly. This appearance is worrisome for tumor such as lymphoma or possibly sarcoma. Hematoma is considered less likely in the absence of trauma.  Moderate abdominopelvic ascites, measuring higher than simple fluid density. No frank peritoneal nodularity.  No free air.  Musculoskeletal: Visualized osseous structures are within normal limits.  IMPRESSION: 15.3 cm soft tissue lesion in the left upper abdomen, worrisome for tumor such as lymphoma  or possibly sarcoma. Hematoma is considered less likely in the absence of trauma.  Associated moderate complex/ high density abdominopelvic ascites. No frank peritoneal nodularity.  No free air per  Given this patient has a history of significant trauma, consider surgical consultation. If biopsy is desired, this is likely amenable via endoscopic ultrasound or percutaneously.  These results were called by telephone at the time of interpretation on 02/20/2014 at 2:20 pm to Dr. Tilden Fossa , who verbally acknowledged these results.   Electronically Signed   By: Charline Bills M.D.   On: 02/20/2014 14:26   Dg Chest Port 1 View  02/20/2014   CLINICAL DATA:  2 hr history of chest pain and shortness of breath  EXAM: PORTABLE CHEST - 1 VIEW  COMPARISON:  November 19, 2012  FINDINGS: Lungs are clear. Heart size and pulmonary vascularity are normal. No adenopathy. No pneumothorax. No bone lesions.  IMPRESSION: No edema or consolidation.   Electronically Signed   By: Bretta Bang III M.D.   On: 02/20/2014 11:11    Labs:  CBC:  Recent Labs  02/20/14 1049 02/20/14 1725  WBC 16.5* 19.4*  HGB 14.1 11.1*  HCT 41.4 32.9*  PLT 282 248    COAGS: No results for input(s): INR, APTT in the last 8760 hours.  BMP:  Recent Labs  02/20/14 1049  NA 136  K 3.9  CL 104  CO2 24  GLUCOSE 167*  BUN 14  CALCIUM 9.3  CREATININE 0.88  GFRNONAA >90  GFRAA >90    LIVER FUNCTION TESTS:  Recent Labs  02/20/14 1049  BILITOT 0.4  AST 27  ALT 27  ALKPHOS 97  PROT 7.1  ALBUMIN 4.4    Assessment and Plan: LUQ mass of uncertain etiology For CT guided biopsy on Mon 2/22 NPO p MN for Mon Labs okay Discussed procedure at lenghth to parents and pt. Pt is very sleepy and I'm not sure he understands but parents were present. Will have pt/parents sign consent on Mon prior to procedure    I spent a total of 20 minutes face to face in clinical consultation/evaluation, greater than 50% of which was  counseling/coordinating care for LUQ mass biopsy  Signed: Brayton El 02/21/2014, 11:51 AM

## 2014-02-22 ENCOUNTER — Inpatient Hospital Stay (HOSPITAL_COMMUNITY): Payer: Medicaid Other

## 2014-02-22 DIAGNOSIS — D649 Anemia, unspecified: Secondary | ICD-10-CM

## 2014-02-22 DIAGNOSIS — R195 Other fecal abnormalities: Secondary | ICD-10-CM

## 2014-02-22 LAB — CBC
HCT: 22.6 % — ABNORMAL LOW (ref 39.0–52.0)
Hemoglobin: 7.6 g/dL — ABNORMAL LOW (ref 13.0–17.0)
MCH: 30.8 pg (ref 26.0–34.0)
MCHC: 33.6 g/dL (ref 30.0–36.0)
MCV: 91.5 fL (ref 78.0–100.0)
Platelets: 172 10*3/uL (ref 150–400)
RBC: 2.47 MIL/uL — ABNORMAL LOW (ref 4.22–5.81)
RDW: 13.9 % (ref 11.5–15.5)
WBC: 7.8 10*3/uL (ref 4.0–10.5)

## 2014-02-22 LAB — PROCALCITONIN: Procalcitonin: <0.1 ng/mL

## 2014-02-22 LAB — MAGNESIUM: Magnesium: 1.7 mg/dL (ref 1.5–2.5)

## 2014-02-22 LAB — URINE CULTURE

## 2014-02-22 LAB — OCCULT BLOOD X 1 CARD TO LAB, STOOL: Fecal Occult Bld: POSITIVE — AB

## 2014-02-22 LAB — BASIC METABOLIC PANEL
Anion gap: 9 (ref 5–15)
BUN: 6 mg/dL (ref 6–23)
CO2: 25 mmol/L (ref 19–32)
Calcium: 8.4 mg/dL (ref 8.4–10.5)
Chloride: 104 mmol/L (ref 96–112)
Creatinine, Ser: 0.87 mg/dL (ref 0.50–1.35)
GFR calc Af Amer: >90 mL/min (ref >90)
GLUCOSE: 117 mg/dL — AB (ref 70–99)
Potassium: 3.5 mmol/L (ref 3.5–5.1)
Sodium: 138 mmol/L (ref 135–145)

## 2014-02-22 LAB — HEMOGLOBIN AND HEMATOCRIT, BLOOD
HCT: 22.1 % — ABNORMAL LOW (ref 39.0–52.0)
HCT: 22.6 % — ABNORMAL LOW (ref 39.0–52.0)
HCT: 23.5 % — ABNORMAL LOW (ref 39.0–52.0)
HEMOGLOBIN: 7.4 g/dL — AB (ref 13.0–17.0)
Hemoglobin: 7.6 g/dL — ABNORMAL LOW (ref 13.0–17.0)
Hemoglobin: 7.9 g/dL — ABNORMAL LOW (ref 13.0–17.0)

## 2014-02-22 LAB — RETICULOCYTES
RBC.: 2.55 MIL/uL — ABNORMAL LOW (ref 4.22–5.81)
Retic Count, Absolute: 66.3 10*3/uL (ref 19.0–186.0)
Retic Ct Pct: 2.6 % (ref 0.4–3.1)

## 2014-02-22 LAB — PROTIME-INR
INR: 1.17 (ref 0.00–1.49)
PROTHROMBIN TIME: 15 s (ref 11.6–15.2)

## 2014-02-22 LAB — VANCOMYCIN, TROUGH: VANCOMYCIN TR: 15.7 ug/mL (ref 10.0–20.0)

## 2014-02-22 LAB — GLUCOSE, CAPILLARY: GLUCOSE-CAPILLARY: 90 mg/dL (ref 70–99)

## 2014-02-22 MED ORDER — PROCHLORPERAZINE EDISYLATE 5 MG/ML IJ SOLN
10.0000 mg | Freq: Four times a day (QID) | INTRAMUSCULAR | Status: DC | PRN
  Administered 2014-02-22 – 2014-02-24 (×4): 10 mg via INTRAVENOUS
  Filled 2014-02-22 (×4): qty 2

## 2014-02-22 MED ORDER — GADOBENATE DIMEGLUMINE 529 MG/ML IV SOLN
15.0000 mL | Freq: Once | INTRAVENOUS | Status: AC | PRN
  Administered 2014-02-22: 15 mL via INTRAVENOUS

## 2014-02-22 NOTE — Treatment Plan (Signed)
Discussed case with GI on call regarding concerns of anemia with heme pos stools. Per GI, since pt was without gross blood/melena source of anemia is more likely secondary to intra-abdominal mass that is currently being worked up. GI agrees with pending biopsy of mass with recs to call if further assistance is needed. Will follow.

## 2014-02-22 NOTE — Progress Notes (Signed)
Pt states numbness has decreased after about 15 minutes, will continue monitor. SRP, RN BSN

## 2014-02-22 NOTE — Progress Notes (Signed)
Pt c/o of numbness on left side and radiates to rights side and upper trunk to neck and face. No facial drooping noted. VSS, NP extender notified will cont to monitor for acute changes. SRP, RN

## 2014-02-22 NOTE — Progress Notes (Addendum)
ANTIBIOTIC CONSULT NOTE - FOLLOW-UP  Pharmacy Consult for Vancomycin and Zosyn Indication: rule out sepsis  No Known Allergies  Patient Measurements: Height: 5\' 6"  (167.6 cm) Weight: 168 lb 14 oz (76.6 kg) IBW/kg (Calculated) : 63.8  Vital Signs: Temp: 97.9 F (36.6 C) (02/21 0438) Temp Source: Oral (02/21 0438) BP: 126/61 mmHg (02/21 0438) Pulse Rate: 75 (02/21 0438) Intake/Output from previous day: 02/20 0701 - 02/21 0700 In: 1670 [P.O.:420; I.V.:750; IV Piggyback:500] Out: 1150 [Urine:1150] Intake/Output from this shift:    Labs:  Recent Labs  02/20/14 1049 02/20/14 1725 02/22/14 0500 02/22/14 0842  WBC 16.5* 19.4* 7.8  --   HGB 14.1 11.1* 7.6* 7.4*  PLT 282 248 172  --   CREATININE 0.88  --  0.87  --    Estimated Creatinine Clearance: 132 mL/min (by C-G formula based on Cr of 0.87).  Recent Labs  02/22/14 0842  VANCOTROUGH 15.7     Microbiology: Recent Results (from the past 720 hour(s))  Culture, Urine     Status: None   Collection Time: 02/20/14  5:48 PM  Result Value Ref Range Status   Specimen Description URINE, CLEAN CATCH  Final   Special Requests NONE  Final   Colony Count   Final    10,000 COLONIES/ML Performed at [facility]    Culture   Final    DIPHTHEROIDS(CORYNEBACTERIUM SPECIES) Note: Standardized susceptibility testing for this organism is not available. Performed at [facility]    Report Status 02/22/2014 FINAL  Final  Culture, blood (routine x 2)     Status: None (Preliminary result)   Collection Time: 02/20/14  6:06 PM  Result Value Ref Range Status   Specimen Description BLOOD RIGHT HAND  5 [facility]  Final   Special Requests NONE  Final   Culture   Final           BLOOD CULTURE RECEIVED NO GROWTH TO DATE CULTURE WILL BE HELD FOR 5 DAYS BEFORE ISSUING A FINAL NEGATIVE REPORT Performed at [facility]    Report Status PENDING  Incomplete  Culture, blood (routine x 2)     Status: None  (Preliminary result)   Collection Time: 02/20/14  6:17 PM  Result Value Ref Range Status   Specimen Description BLOOD LEFT HAND  3 [facility]  [facility]  Final   Special Requests NONE  Final   Culture   Final           BLOOD CULTURE RECEIVED NO GROWTH TO DATE CULTURE WILL BE HELD FOR 5 DAYS BEFORE ISSUING A FINAL NEGATIVE REPORT Performed at [facility]    Report Status PENDING  Incomplete  MRSA PCR Screening     Status: None   Collection Time: 02/20/14  7:57 PM  Result Value Ref Range Status   MRSA by PCR NEGATIVE NEGATIVE Final    Comment:        The GeneXpert MRSA Assay (FDA approved for NASAL specimens only), is one component of a comprehensive MRSA colonization surveillance program. It is not intended to diagnose MRSA infection nor to guide or monitor treatment for MRSA infections.     Medical History: Past Medical History  Diagnosis Date  . ADHD (attention deficit hyperactivity disorder)     Medications:  Scheduled:  . piperacillin-tazobactam (ZOSYN)  IV  3.375 g Intravenous Q8H  . vancomycin  1,000 mg Intravenous Q8H   Infusions:  . sodium chloride 50 mL/hr at 02/21/14 2245  PRN: acetaminophen **OR** acetaminophen, HYDROmorphone (DILAUDID) injection, ketorolac, ondansetron **OR** ondansetron (ZOFRAN) IV  Assessment: 19 y/o M presented to ED 2/19 with dry heaves, epigastric and lower chest pain, and syncopal episode in the ED. Abdominal CT reveals a 15 cm abdominal mass of uncertain etiology. [facility] MD has consulted pharmacy to dose vancomycin and zosyn for sepsis, as patient found to be hypothermic, hypotensive, tachycardic, and tachypneic in ED.  2/19 >> Vancomycin >> 2/19 >> Zosyn >>  Tmax: AF WBC: improved to WNL Renal: SCr 0.87, CrCl > 100 mL/min CG PCT: < 0.1 (2/19, 2/21) Lactic Acid: 2.1 (2/19)  Vancomycin trough level: 15.7 mcg/mL  2/19 blood x 2: NGTD 2/19: urine: 10,000 colonies.mL Diphtheroids (corynebacterium species) 2/19 MRSA PCR:  negative  Goal of Therapy:  Vancomycin trough level 15-20 mcg/ml  Zosyn dose per indication, renal function  Plan:   Continue Vancomycin 1g IV q8h, as trough level therapeutic.  Continue Zosyn 3.375g IV q8h (infuse over 4 hours).  Follow up renal function & cultures, clinical course.  Follow up de-escalation and duration of therapy.   Greer Pickerel, PharmD, BCPS Pager: [phone] 02/22/2014 10:12 AM

## 2014-02-22 NOTE — Progress Notes (Signed)
[facility] PROGRESS NOTE  Rubert Frediani Deats ZOX:[id number] DOB: [birth year] DOA: 02/20/2014 PCP: Cresenciano Lick, MD  Assessment/Plan: 1. Abdominal mass with abd pain 1. 15.3cm L upper abd mass worrisome for tumor noted on imaging 2. General Surgery following, recs for IR guided biopsy 3. Pt seen by IR with plans for biopsy on 2/22 4. Have decreased frequency of narcotics as pt noted to be more sleepy. Now alertness improved 5. Have added toradol for mild/mod pain and reserve dilaudid for more severe pain 6. Renal function is normal and no listed allergies to toradol/nsaids 2. SIRS 1. Unclear etiology 2. WBC now normalized to 7.8k 3. UA is neg for UTI 4. Blood cultures thus far w/o growth, still pending final results 5. Pt remains on empiric zosyn and vanc for now. Will continue for now 3. Syncope/bradycardia 1. Level of alertness at baseline today after decreasing dose of narcotic 4. DVT prophylaxis 1. SCD's 5. Anemia 1. Hgb drop from around 11.1 to 7.6 from 2/19 to 2/21 2. Stools are heme positive  3. Iron studies, folate, b12, haptoglobin ordered, pending 4. Follow serial h/h 5. Given above findings, consider GI consultation for additional recs  Code Status: Full Family Communication: Pt in room (indicate person spoken with, relationship, and if by phone, the number) Disposition Plan: Pending   Consultants:  General Surgery  IR  Procedures:    Antibiotics:  Zosyn 2/19>>> (indicate start date, and stop date if known)  Vancomycin 2/19>>>  HPI/Subjective: Pt reports feeling better today. Feels more nauseated  Objective: Filed Vitals:   02/21/14 1600 02/21/14 1727 02/21/14 2110 02/22/14 0438  BP: 125/51 132/67 122/79 126/61  Pulse: 77 93 98 75  Temp: 98.6 F (37 C) 98.6 F (37 C) 97.8 F (36.6 C) 97.9 F (36.6 C)  TempSrc: Oral Oral Oral Oral  Resp: Height:      Weight:    76.6 kg (168 lb 14 oz)  SpO2: 99% 100% 100% 100%     Intake/Output Summary (Last 24 hours) at 02/22/14 1557 Last data filed at 02/22/14 1200  Gross per 24 hour  Intake   1275 ml  Output   1150 ml  Net    125 ml   Filed Weights   02/20/14 1945 02/21/14 0500 02/22/14 0438  Weight: 76.6 kg (168 lb 14 oz) 79 kg (174 lb 2.6 oz) 76.6 kg (168 lb 14 oz)    Exam:   General:  Awake, in nad  Cardiovascular: regular, s1, s2  Respiratory: normal resp effort, no wheezing  Abdomen: soft, nondistended  Musculoskeletal: perfused, no clubbing   Data Reviewed: Basic Metabolic Panel:  Recent Labs Lab 02/20/14 1049 02/22/14 0500  NA 136 138  K 3.9 3.5  CL 104 104  CO2 24 25  GLUCOSE 167* 117*  BUN 14 6  CREATININE 0.88 0.87  CALCIUM 9.3 8.4  MG  --  1.7   Liver Function Tests:  Recent Labs Lab 02/20/14 1049  AST 27  ALT 27  ALKPHOS 97  BILITOT 0.4  PROT 7.1  ALBUMIN 4.4    Recent Labs Lab 02/20/14 1049  LIPASE 28   No results for input(s): AMMONIA in the last 168 hours. CBC:  Recent Labs Lab 02/20/14 1049 02/20/14 1725 02/22/14 0500 02/22/14 0842 02/22/14 1310  WBC 16.5* 19.4* 7.8  --   --   NEUTROABS 13.3*  --   --   --   --   HGB 14.1 11.1* 7.6* 7.4* 7.6*  HCT 41.4 32.9* 22.6* 22.1* 22.6*  MCV 91.2 90.4 91.5  --   --   PLT 282 248 172  --   --    Cardiac Enzymes:  Recent Labs Lab 02/20/14 2340 02/21/14 0538  TROPONINI <0.03 <0.03   BNP (last 3 results)  Recent Labs  02/20/14 1743  BNP 31.5    ProBNP (last 3 results) No results for input(s): PROBNP in the last 8760 hours.  CBG:  Recent Labs Lab 02/21/14 0813 02/22/14 0851  GLUCAP 109* 90    Recent Results (from the past 240 hour(s))  Culture, Urine     Status: None   Collection Time: 02/20/14  5:48 PM  Result Value Ref Range Status   Specimen Description URINE, CLEAN CATCH  Final   Special Requests NONE  Final   Colony Count   Final    10,000 COLONIES/ML Performed at [facility]    Culture   Final     DIPHTHEROIDS(CORYNEBACTERIUM SPECIES) Note: Standardized susceptibility testing for this organism is not available. Performed at [facility]    Report Status 02/22/2014 FINAL  Final  Culture, blood (routine x 2)     Status: None (Preliminary result)   Collection Time: 02/20/14  6:06 PM  Result Value Ref Range Status   Specimen Description BLOOD RIGHT HAND  5 [facility]  Final   Special Requests NONE  Final   Culture   Final           BLOOD CULTURE RECEIVED NO GROWTH TO DATE CULTURE WILL BE HELD FOR 5 DAYS BEFORE ISSUING A FINAL NEGATIVE REPORT Performed at [facility]    Report Status PENDING  Incomplete  Culture, blood (routine x 2)     Status: None (Preliminary result)   Collection Time: 02/20/14  6:17 PM  Result Value Ref Range Status   Specimen Description BLOOD LEFT HAND  3 [facility]  [facility]OTT[facility]  Final   Special Requests NONE  Final   Culture   Final           BLOOD CULTURE RECEIVED NO GROWTH TO DATE CULTURE WILL BE HELD FOR 5 DAYS BEFORE ISSUING A FINAL NEGATIVE REPORT Performed at [facility]    Report Status PENDING  Incomplete  MRSA PCR Screening     Status: None   Collection Time: 02/20/14  7:57 PM  Result Value Ref Range Status   MRSA by PCR NEGATIVE NEGATIVE Final    Comment:        The GeneXpert MRSA Assay (FDA approved for NASAL specimens only), is one component of a comprehensive MRSA colonization surveillance program. It is not intended to diagnose MRSA infection nor to guide or monitor treatment for MRSA infections.      Studies: Ct Head Wo Contrast  02/21/2014   CLINICAL DATA:  Facial numbness.  EXAM: CT HEAD WITHOUT CONTRAST  TECHNIQUE: Contiguous axial images were obtained from the base of the skull through the vertex without intravenous contrast.  COMPARISON:  None.  FINDINGS: The ventricles are normal in size and configuration. No extra-axial fluid collections are identified. The gray-white differentiation is normal. No  CT findings for acute intracranial process such as hemorrhage or infarction. No mass lesions. The brainstem and cerebellum are grossly normal.  The bony structures are intact. The paranasal sinuses and mastoid air cells are clear. The globes are intact.  IMPRESSION: Normal head CT.   Electronically Signed   By: Orlene PlumP.  Gallerani M.D.  On: 02/21/2014 08:13    Scheduled Meds: . piperacillin-tazobactam (ZOSYN)  IV  3.375 g Intravenous Q8H  . vancomycin  1,000 mg Intravenous Q8H   Continuous Infusions: . sodium chloride 50 mL/hr at 02/22/14 1512    Principal Problem:   Abdominal mass Active Problems:   Syncope   Bradycardia   Sepsis   CHIU, STEPHEN K  [facility] Pager [phone]. If 7PM-7AM, please contact night-coverage at [email or web address], password [facility] 02/22/2014, 3:57 PM  LOS: 2 days

## 2014-02-22 NOTE — Progress Notes (Signed)
[facility] progress note. Chief complaint. Numbness extending from the trunk, to the neck, and face. History of present illness. This 19 year old male in hospital with complaints of abdominal pain noted to have a 15.3 cm left upper abdominal mass worrisome for tumor on imaging. Patient for CT guided biopsy over the next 48 hours. Earlier in hospitalization the patient was seen by critical care nursing for complaints of numbness around the mouth and some left side slight facial droop. CT scan of the head was done at that time and resulted unremarkable. Patient complained to nursing tonight of numbness starting at trunk and extending to the neck and face bilaterally. Symptoms lasted for about 15 minutes and then self resolved. There is no associated pain or movement difficulty. I came up to see the patient at bedside and by the time I arrived numbness had resolved. Physical exam. Vital signs. Temperature 97.9, pulse 75, respiration 18, blood pressure 126/61. O2 sats 100%. General appearance. Well-developed male who is alert and oriented. Cardiac. Rate and rhythm regular. Lungs. Breath sounds are clear. Neurologic. Cranial nerves II-12 grossly intact. Strength 5/5 and equal 4 bilaterally. Soft touch appears intact. Impression/plan. Problem #1. Transient torso, bilateral arm, and bilateral facial numbness. Patient states he has had prior incidence of transient numbness around his mouth over the past 5 or 6 years. I contacted Dr. Amada JupiterKirkpatrick neurology and discussed the case. He did not feel that symptoms were suggestive of TIA and indicated a repeat CT scan of the brain would not be warranted at this time. He recommends checking electrolytes including calcium and magnesium. Also an MRI of the brain with contrast and EEG. All these been ordered and results are pending. If formal neurology consult is felt to be required this will need to be requested later this morning.

## 2014-02-23 ENCOUNTER — Inpatient Hospital Stay (HOSPITAL_COMMUNITY): Payer: Medicaid Other

## 2014-02-23 ENCOUNTER — Inpatient Hospital Stay (HOSPITAL_COMMUNITY)
Admission: EM | Admit: 2014-02-23 | Discharge: 2014-02-23 | Disposition: A | Discharge status: Home / Self Care | Payer: Medicaid Other | Source: Home / Self Care | Attending: Internal Medicine | Admitting: Internal Medicine

## 2014-02-23 ENCOUNTER — Other Ambulatory Visit: Payer: Self-pay | Admitting: Internal Medicine

## 2014-02-23 DIAGNOSIS — N179 Acute kidney failure, unspecified: Secondary | ICD-10-CM

## 2014-02-23 LAB — COMPREHENSIVE METABOLIC PANEL
ALBUMIN: 3.1 g/dL — AB (ref 3.5–5.2)
ALK PHOS: 62 U/L (ref 39–117)
ALT: 17 U/L (ref 0–53)
ANION GAP: 6 (ref 5–15)
AST: 16 U/L (ref 0–37)
BUN: 9 mg/dL (ref 6–23)
CALCIUM: 8.5 mg/dL (ref 8.4–10.5)
CO2: 26 mmol/L (ref 19–32)
CREATININE: 2.37 mg/dL — AB (ref 0.50–1.35)
Chloride: 106 mmol/L (ref 96–112)
GFR calc Af Amer: 44 mL/min — ABNORMAL LOW (ref >90)
GFR, EST NON AFRICAN AMERICAN: 38 mL/min — AB (ref >90)
Glucose, Bld: 90 mg/dL (ref 70–99)
Potassium: 3.7 mmol/L (ref 3.5–5.1)
Sodium: 138 mmol/L (ref 135–145)
Total Bilirubin: 0.9 mg/dL (ref 0.3–1.2)
Total Protein: 5.6 g/dL — ABNORMAL LOW (ref 6.0–8.3)

## 2014-02-23 LAB — IRON AND TIBC
Iron: 18 ug/dL — ABNORMAL LOW (ref 42–165)
Saturation Ratios: 7 % — ABNORMAL LOW (ref 20–55)
TIBC: 242 ug/dL (ref 215–435)
UIBC: 224 ug/dL (ref 125–400)

## 2014-02-23 LAB — FOLATE RBC
FOLATE, HEMOLYSATE: 315.8 ng/mL
Folate, RBC: 1350 ng/mL (ref >498)
HEMATOCRIT: 23.4 % — AB (ref 37.5–51.0)

## 2014-02-23 LAB — ABO/RH: ABO/RH(D): A POS

## 2014-02-23 LAB — CBC WITH DIFFERENTIAL/PLATELET
Basophils Absolute: 0 10*3/uL (ref 0.0–0.1)
Basophils Relative: 0 % (ref 0–1)
EOS PCT: 3 % (ref 0–5)
Eosinophils Absolute: 0.3 10*3/uL (ref 0.0–0.7)
HCT: 22.2 % — ABNORMAL LOW (ref 39.0–52.0)
HEMOGLOBIN: 7.4 g/dL — AB (ref 13.0–17.0)
LYMPHS PCT: 20 % (ref 12–46)
Lymphs Abs: 1.7 10*3/uL (ref 0.7–4.0)
MCH: 30.1 pg (ref 26.0–34.0)
MCHC: 33.3 g/dL (ref 30.0–36.0)
MCV: 90.2 fL (ref 78.0–100.0)
MONOS PCT: 11 % (ref 3–12)
Monocytes Absolute: 1 10*3/uL (ref 0.1–1.0)
Neutro Abs: 5.5 10*3/uL (ref 1.7–7.7)
Neutrophils Relative %: 66 % (ref 43–77)
Platelets: 163 10*3/uL (ref 150–400)
RBC: 2.46 MIL/uL — AB (ref 4.22–5.81)
RDW: 13.6 % (ref 11.5–15.5)
WBC: 8.5 10*3/uL (ref 4.0–10.5)

## 2014-02-23 LAB — TYPE AND SCREEN
ABO/RH(D): A POS
Antibody Screen: NEGATIVE

## 2014-02-23 LAB — BASIC METABOLIC PANEL
ANION GAP: 6 (ref 5–15)
BUN: 9 mg/dL (ref 6–23)
CO2: 25 mmol/L (ref 19–32)
Calcium: 8.5 mg/dL (ref 8.4–10.5)
Chloride: 109 mmol/L (ref 96–112)
Creatinine, Ser: 2.39 mg/dL — ABNORMAL HIGH (ref 0.50–1.35)
GFR, EST AFRICAN AMERICAN: 43 mL/min — AB (ref >90)
GFR, EST NON AFRICAN AMERICAN: 37 mL/min — AB (ref >90)
Glucose, Bld: 97 mg/dL (ref 70–99)
Potassium: 3.6 mmol/L (ref 3.5–5.1)
Sodium: 140 mmol/L (ref 135–145)

## 2014-02-23 LAB — GLUCOSE, CAPILLARY: Glucose-Capillary: 90 mg/dL (ref 70–99)

## 2014-02-23 LAB — HIV ANTIBODY (ROUTINE TESTING W REFLEX): HIV SCREEN 4TH GENERATION: NONREACTIVE

## 2014-02-23 LAB — VITAMIN B12: VITAMIN B 12: 564 pg/mL (ref 211–911)

## 2014-02-23 MED ORDER — FENTANYL CITRATE 0.05 MG/ML IJ SOLN
INTRAMUSCULAR | Status: AC | PRN
  Administered 2014-02-23 (×2): 50 ug via INTRAVENOUS

## 2014-02-23 MED ORDER — FERROUS SULFATE 325 (65 FE) MG PO TABS
325.0000 mg | ORAL_TABLET | Freq: Every day | ORAL | Status: DC
  Administered 2014-02-23 – 2014-02-25 (×2): 325 mg via ORAL
  Filled 2014-02-23 (×3): qty 1

## 2014-02-23 MED ORDER — FENTANYL CITRATE 0.05 MG/ML IJ SOLN
INTRAMUSCULAR | Status: AC
  Filled 2014-02-23: qty 4

## 2014-02-23 MED ORDER — MIDAZOLAM HCL 2 MG/2ML IJ SOLN
INTRAMUSCULAR | Status: AC
  Filled 2014-02-23: qty 6

## 2014-02-23 MED ORDER — HYDROMORPHONE HCL 1 MG/ML IJ SOLN
0.5000 mg | INTRAMUSCULAR | Status: DC | PRN
  Administered 2014-02-23 – 2014-02-27 (×22): 0.5 mg via INTRAVENOUS
  Filled 2014-02-23 (×21): qty 1

## 2014-02-23 MED ORDER — MIDAZOLAM HCL 2 MG/2ML IJ SOLN
INTRAMUSCULAR | Status: AC | PRN
  Administered 2014-02-23 (×4): 1 mg via INTRAVENOUS

## 2014-02-23 NOTE — Progress Notes (Signed)
Subjective: He is back from CT biopsy.  Still has some discomfort lower abdomen.  Not really complaining of upper abdomen.    Objective: Vital signs in last 24 hours: Temp:  [97.9 F (36.6 C)-98.5 F (36.9 C)] 98.5 F (36.9 C) (02/22 0640) Pulse Rate:  [73-88] 73 (02/22 0640) Resp:  [18] 18 (02/22 0640) BP: (115-139)/(61-75) 139/72 mmHg (02/22 0640) SpO2:  [98 %-100 %] 100 % (02/22 0640) Weight:  [76 kg (167 lb 8.8 oz)] 76 kg (167 lb 8.8 oz) (02/22 0640) Last BM Date: 02/22/14 PO 360 recorded yesterday Currently NPO 1 stool recorded. H/H is down still  14.1/41 on admit down to 7.4/22.2 Creatinine is up to 2.37  CT head on 2/20 was negative. Intake/Output from previous day: 02/21 0701 - 02/22 0700 In: 2972.5 [P.O.:360; I.V.:1612.5; IV Piggyback:1000] Out: -  Intake/Output this shift:    General appearance: alert, cooperative and no distress GI: few BS, still tender lower abdomen on palpation.    Lab Results:   Recent Labs  02/22/14 0500  02/22/14 1708 02/23/14 0500  WBC 7.8  --   --  8.5  HGB 7.6*  < > 7.9* 7.4*  HCT 22.6*  < > 23.5* 22.2*  PLT 172  --   --  163  < > = values in this interval not displayed.  BMET  Recent Labs  02/22/14 0500 02/23/14 0500  NA 138 138  K 3.5 3.7  CL 104 106  CO2 25 26  GLUCOSE 117* 90  BUN 6 9  CREATININE 0.87 2.37*  CALCIUM 8.4 8.5   PT/INR  Recent Labs  02/22/14 0500  LABPROT 15.0  INR 1.17     Recent Labs Lab 02/20/14 1049 02/23/14 0500  AST 27 16  ALT 27 17  ALKPHOS 97 62  BILITOT 0.4 0.9  PROT 7.1 5.6*  ALBUMIN 4.4 3.1*     Lipase     Component Value Date/Time   LIPASE 28 02/20/2014 1049     Studies/Results: Ct Head Wo Contrast  02/21/2014   CLINICAL DATA:  Facial numbness.  EXAM: CT HEAD WITHOUT CONTRAST  TECHNIQUE: Contiguous axial images were obtained from the base of the skull through the vertex without intravenous contrast.  COMPARISON:  None.  FINDINGS: The ventricles are normal in  size and configuration. No extra-axial fluid collections are identified. The gray-white differentiation is normal. No CT findings for acute intracranial process such as hemorrhage or infarction. No mass lesions. The brainstem and cerebellum are grossly normal.  The bony structures are intact. The paranasal sinuses and mastoid air cells are clear. The globes are intact.  IMPRESSION: Normal head CT.   Electronically Signed   By: Rudie Meyer M.D.   On: 02/21/2014 08:13    Medications: . piperacillin-tazobactam (ZOSYN)  IV  3.375 g Intravenous Q8H  . vancomycin  1,000 mg Intravenous Q8H    Assessment/Plan 1. 15 cm LUQ abdominal mass of uncertain etiology 2. SIRS 3. Syncope/BRADYCARDIA in the ED 4. New Anemia 5. Positive fecal occult 6.  ADHD 7.  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  Elevated creatinine 9.  SCD's for DVT prophylaxis.   Plan: I ordered a repeat of his BMP, type and screen.  Results from biopsy is pending.  Dr. Rhona Leavens says he talked with GI about drop in H/H.  We are concerned he bleed from the time he arrived in ED, wonder if the mass isn't really blood from a spontaneous bleed.  The  creatinine rise is real. Dr. Rhona Leavenshiu aware and making necessary changes.     LOS: 3 days    Omar Sheppard 02/23/2014

## 2014-02-23 NOTE — Progress Notes (Signed)
[facility] PROGRESS NOTE  Omar Sheppard ZOX:[id number] DOB: 05/11/1995 DOA: 02/20/2014 PCP: Cresenciano Lick, MD  Assessment/Plan: 1. Abdominal mass with abd pain 1. 15.3cm L upper abd mass noted on imaging 2. General Surgery is following, recs for IR guided biopsy which was done on 2/22 3. Awaiting results 2. SIRS 1. Unclear etiology 2. WBC now normalized to 7.8k 3. UA is neg for UTI 4. Blood cultures thus far w/o growth, still pending final results 5. Cont on zosyn alone for now. See below 3. Acute renal failure 1. New finding today 2. Ordered renal US with no hydronephrosis 3. Have d/c'd toradol and vancomycin 4. Increase IVF as tolerated 5. Pt reports good urine output 6. Follow renal function 4. Syncope/bradycardia 1. Normal level of alertness today 5. DVT prophylaxis 1. SCD's 6. Anemia 1. Hgb drop from around 11.1 to 7.6 from 2/19 to 2/21 2. Stools are heme positive 3. Iron low at 18 4. Will add oral iron 5. Given anemia, had discussed case with GI on 2/21. See note from that day.  Code Status: Full Family Communication: Pt in room, family at bedside Disposition Plan: Pending   Consultants:  General Surgery  IR  Discussed case with GI over phone  Procedures:    Antibiotics:  Zosyn 2/19>>> (indicate start date, and stop date if known)  Vancomycin 2/19>>>2/22  HPI/Subjective: Pt reports feeling "great." No complaints.  Objective: Filed Vitals:   02/23/14 8119 02/23/14 0845 02/23/14 0852 02/23/14 1521  BP: 121/53 131/55 123/55 136/75  Pulse: 72 74 77 99  Temp:    98.5 F (36.9 C)  TempSrc:    Oral  Resp: Height:      Weight:      SpO2: 100% 100% 100% 99%    Intake/Output Summary (Last 24 hours) at 02/23/14 1556 Last data filed at 02/23/14 1521  Gross per 24 hour  Intake 2232.5 ml  Output      0 ml  Net 2232.5 ml   Filed Weights   02/21/14 0500 02/22/14 0438 02/23/14 0640  Weight: 79 kg (174 lb 2.6  oz) 76.6 kg (168 lb 14 oz) 76 kg (167 lb 8.8 oz)    Exam:   General:  Awake, sitting upright in bed, in nad  Cardiovascular: regular, s1, s2  Respiratory: normal resp effort, no wheezing  Abdomen: soft, nondistended  Musculoskeletal: perfused, no clubbing   Data Reviewed: Basic Metabolic Panel:  Recent Labs Lab 02/20/14 1049 02/22/14 0500 02/23/14 0500 02/23/14 0920  NA 136 138 138 140  K 3.9 3.5 3.7 3.6  CL 104 104 106 109  CO2 GLUCOSE 167* 117* 90 97  BUN CREATININE 0.88 0.87 2.37* 2.39*  CALCIUM 9.3 8.4 8.5 8.5  MG  --  1.7  --   --    Liver Function Tests:  Recent Labs Lab 02/20/14 1049 02/23/14 0500  AST 27 16  ALT 27 17  ALKPHOS 97 62  BILITOT 0.4 0.9  PROT 7.1 5.6*  ALBUMIN 4.4 3.1*    Recent Labs Lab 02/20/14 1049  LIPASE 28   No results for input(s): AMMONIA in the last 168 hours. CBC:  Recent Labs Lab 02/20/14 1049 02/20/14 1725 02/22/14 0500 02/22/14 0842 02/22/14 1310 02/22/14 1708 02/23/14 0500  WBC 16.5* 19.4* 7.8  --   --   --  8.5  NEUTROABS 13.3*  --   --   --   --   --  5.5  HGB 14.1 11.1* 7.6* 7.4* 7.6* 7.9* 7.4*  HCT 41.4 32.9* 22.6* 22.1* 22.6* 23.5*  23.4* 22.2*  MCV 91.2 90.4 91.5  --   --   --  90.2  PLT 282 248 172  --   --   --  163   Cardiac Enzymes:  Recent Labs Lab 02/20/14 2340 02/21/14 0538  TROPONINI <0.03 <0.03   BNP (last 3 results)  Recent Labs  02/20/14 1743  BNP 31.5    ProBNP (last 3 results) No results for input(s): PROBNP in the last 8760 hours.  CBG:  Recent Labs Lab 02/21/14 0813 02/22/14 0851 02/23/14 0944  GLUCAP 109* 90 90    Recent Results (from the past 240 hour(s))  Culture, Urine     Status: None   Collection Time: 02/20/14  5:48 PM  Result Value Ref Range Status   Specimen Description URINE, CLEAN CATCH  Final   Special Requests NONE  Final   Colony Count   Final    10,000 COLONIES/ML Performed at [facility]    Culture    Final    DIPHTHEROIDS(CORYNEBACTERIUM SPECIES) Note: Standardized susceptibility testing for this organism is not available. Performed at [facility]    Report Status 02/22/2014 FINAL  Final  Culture, blood (routine x 2)     Status: None (Preliminary result)   Collection Time: 02/20/14  6:06 PM  Result Value Ref Range Status   Specimen Description BLOOD RIGHT HAND  [facility]  Final   Special Requests NONE  Final   Culture   Final           BLOOD CULTURE RECEIVED NO GROWTH TO DATE CULTURE WILL BE HELD FOR 5 DAYS BEFORE ISSUING A FINAL NEGATIVE REPORT Performed at [facility]    Report Status PENDING  Incomplete  Culture, blood (routine x 2)     Status: None (Preliminary result)   Collection Time: 02/20/14  6:17 PM  Result Value Ref Range Status   Specimen Description BLOOD LEFT HAND  3 [facility]  [facility]OTTLE  Final   Special Requests NONE  Final   Culture   Final           BLOOD CULTURE RECEIVED NO GROWTH TO DATE CULTURE WILL BE HELD FOR 5 DAYS BEFORE ISSUING A FINAL NEGATIVE REPORT Performed at [facility]    Report Status PENDING  Incomplete  MRSA PCR Screening     Status: None   Collection Time: 02/20/14  7:57 PM  Result Value Ref Range Status   MRSA by PCR NEGATIVE NEGATIVE Final    Comment:        The GeneXpert MRSA Assay (FDA approved for NASAL specimens only), is one component of a comprehensive MRSA colonization surveillance program. It is not intended to diagnose MRSA infection nor to guide or monitor treatment for MRSA infections.      Studies: [place] Renal  02/23/2014   CLINICAL DATA:  Acute renal failure.  EXAM: RENAL/URINARY TRACT ULTRASOUND COMPLETE  COMPARISON:  None.  FINDINGS: Right Kidney:  Length: 11.5 cm. Increased parenchymal echogenicity. No renal mass or stone. No hydronephrosis.  Left Kidney:  Length: 12.0 cm. Increased parenchymal echogenicity. No renal mass or stone. No hydronephrosis.  Bladder:  Appears normal for  degree of bladder distention.  Small amount of ascites is seen along the inferior margin of the liver.  IMPRESSION: 1. No hydronephrosis. 2. Increased renal parenchymal echogenicity bilaterally consistent with medical  renal disease. 3. Small amount of ascites. 4. No other abnormalities.   Electronically Signed   By: Amie Portland M.D.   On: 02/23/2014 12:09   Ct Biopsy  02/23/2014   CLINICAL DATA:  Left upper quadrant mass  EXAM: CT GUIDED CORE BIOPSY OF LEFT UPPER ABDOMINAL MASS  ANESTHESIA/SEDATION: Intravenous Fentanyl and Versed were administered as conscious sedation during continuous cardiorespiratory monitoring by the radiology RN, with a total moderate sedation time of 10 minutes.  PROCEDURE: The procedure risks, benefits, and alternatives were explained to the patient. Questions regarding the procedure were encouraged and answered. The patient understands and consents to the procedure.  Select axial scans through the upper abdomen were obtained. The mass was localized and an appropriate skin entry site was determined and marked.  The operative field was prepped with Betadinein a sterile fashion, and a sterile drape was applied covering the operative field. A sterile gown and sterile gloves were used for the procedure. Local anesthesia was provided with 1% Lidocaine.  Under CT fluoroscopic guidance, a 17 gauge trocar needle was advanced to the margin of the lesion. Once needle tip position was confirmed, coaxial 18-gauge core biopsy samples were obtained, submitted in formalin to surgical pathology. The guide needle was removed. Postprocedure scans show no hemorrhage or other apparent complication. The patient tolerated the procedure well.  COMPLICATIONS: None immediate  FINDINGS: Limited CT scanning again demonstrated hyperdense mass external to the greater curvature of the stomach, anterior to the body and tail of the pancreas. Percutaneous core biopsy samples were obtained.  IMPRESSION: 1. Technically  successful CT-guided core biopsy of left upper abdominal mass.   Electronically Signed   By: Corlis Leak M.D.   On: 02/23/2014 09:48    Scheduled Meds: . fentaNYL      . midazolam      . piperacillin-tazobactam (ZOSYN)  IV  3.375 g Intravenous Q8H   Continuous Infusions: . sodium chloride 125 mL/hr at 02/23/14 1300    Principal Problem:   Abdominal mass Active Problems:   Syncope   Bradycardia   Sepsis   Omar Sheppard K  [facility] Pager [phone]. If 7PM-7AM, please contact night-coverage at [email or web address], password [facility] 02/23/2014, 3:56 PM  LOS: 3 days

## 2014-02-23 NOTE — Procedures (Signed)
CT 18g core bx LUQ mass x3 to surg path No complication No blood loss. [facility].

## 2014-02-23 NOTE — Procedures (Signed)
ELECTROENCEPHALOGRAM REPORT   Patient: Omar MagnusonDustin Mccoy [facility]       Room #: 1423 EEG No. ID: Age: 19 y.o.        Sex: male Referring Physician: Dr Rhona Leavenshiu Report Date:  02/23/2014        Interpreting Physician: Elspeth ChoSUMNER, Omar Sheppard, Omar Sheppard  History: Omar Sheppard is an 19 y.o. male with transient episode of torso, bilateral arm and bilateral facial numbness  Medications:  Scheduled: . fentaNYL      . midazolam      . piperacillin-tazobactam (ZOSYN)  IV  3.375 g Intravenous Q8H    Conditions of Recording:  This is a 16 channel EEG carried out with the patient in the drowsy state.  Description:  The waking background activity consists of a low voltage, symmetrical, fairly well organized, 8-9Hz  Hz alpha activity, seen from the parieto-occipital and posterior temporal regions. There is frequent theta activity mixed in. No focal slowing or epileptiform activity noted. Normal sleep architecture is not appreciated.   Hyperventilation produced a mild to moderate buildup but failed to elicit any abnormalities. Intermittent photic stimulation was performed and elicits a symmetrical driving response but fails to elicit any abnormalities.  IMPRESSION: Normal electroencephalogram, awake, and with activation procedures. There are no focal lateralizing or epileptiform features.    Elspeth Choeter Rukaya Kleinschmidt, DO [facility] [phone]  If 7pm- 7am, please page neurology on call as listed in AMION. 02/23/2014, 3:06 PM

## 2014-02-23 NOTE — Progress Notes (Signed)
EEG Completed; Results Pending  

## 2014-02-24 DIAGNOSIS — R112 Nausea with vomiting, unspecified: Secondary | ICD-10-CM

## 2014-02-24 DIAGNOSIS — K661 Hemoperitoneum: Secondary | ICD-10-CM

## 2014-02-24 LAB — BASIC METABOLIC PANEL
Anion gap: 6 (ref 5–15)
BUN: 10 mg/dL (ref 6–23)
CHLORIDE: 105 mmol/L (ref 96–112)
CO2: 24 mmol/L (ref 19–32)
Calcium: 8.3 mg/dL — ABNORMAL LOW (ref 8.4–10.5)
Creatinine, Ser: 2.71 mg/dL — ABNORMAL HIGH (ref 0.50–1.35)
GFR calc Af Amer: 37 mL/min — ABNORMAL LOW (ref >90)
GFR calc non Af Amer: 32 mL/min — ABNORMAL LOW (ref >90)
Glucose, Bld: 94 mg/dL (ref 70–99)
POTASSIUM: 3.6 mmol/L (ref 3.5–5.1)
SODIUM: 135 mmol/L (ref 135–145)

## 2014-02-24 LAB — URINALYSIS, ROUTINE W REFLEX MICROSCOPIC
Bilirubin Urine: NEGATIVE
GLUCOSE, UA: NEGATIVE mg/dL
KETONES UR: NEGATIVE mg/dL
Leukocytes, UA: NEGATIVE
NITRITE: NEGATIVE
PH: 6 (ref 5.0–8.0)
PROTEIN: NEGATIVE mg/dL
Specific Gravity, Urine: 1.009 (ref 1.005–1.030)
UROBILINOGEN UA: 0.2 mg/dL (ref 0.0–1.0)

## 2014-02-24 LAB — CBC
HCT: 21.6 % — ABNORMAL LOW (ref 39.0–52.0)
Hemoglobin: 7.3 g/dL — ABNORMAL LOW (ref 13.0–17.0)
MCH: 30.7 pg (ref 26.0–34.0)
MCHC: 33.8 g/dL (ref 30.0–36.0)
MCV: 90.8 fL (ref 78.0–100.0)
Platelets: 176 10*3/uL (ref 150–400)
RBC: 2.38 MIL/uL — AB (ref 4.22–5.81)
RDW: 13.8 % (ref 11.5–15.5)
WBC: 8.4 10*3/uL (ref 4.0–10.5)

## 2014-02-24 LAB — PROCALCITONIN

## 2014-02-24 LAB — URINE MICROSCOPIC-ADD ON

## 2014-02-24 LAB — CREATININE, URINE, RANDOM: CREATININE, URINE: 76.05 mg/dL

## 2014-02-24 LAB — HAPTOGLOBIN: Haptoglobin: 237 mg/dL — ABNORMAL HIGH (ref 34–200)

## 2014-02-24 LAB — SODIUM, URINE, RANDOM: Sodium, Ur: 86 mmol/L

## 2014-02-24 LAB — GLUCOSE, CAPILLARY: GLUCOSE-CAPILLARY: 102 mg/dL — AB (ref 70–99)

## 2014-02-24 MED ORDER — PROMETHAZINE HCL 25 MG/ML IJ SOLN
12.5000 mg | Freq: Four times a day (QID) | INTRAMUSCULAR | Status: DC | PRN
  Administered 2014-02-25: 12.5 mg via INTRAVENOUS
  Filled 2014-02-24: qty 1

## 2014-02-24 MED ORDER — METOCLOPRAMIDE HCL 5 MG/ML IJ SOLN
10.0000 mg | Freq: Once | INTRAMUSCULAR | Status: AC
  Administered 2014-02-25: 10 mg via INTRAVENOUS
  Filled 2014-02-24: qty 2

## 2014-02-24 MED ORDER — ONDANSETRON HCL 4 MG/2ML IJ SOLN
4.0000 mg | INTRAMUSCULAR | Status: DC | PRN
  Administered 2014-02-24 – 2014-02-28 (×18): 4 mg via INTRAVENOUS
  Filled 2014-02-24 (×18): qty 2

## 2014-02-24 MED ORDER — ONDANSETRON HCL 4 MG PO TABS
4.0000 mg | ORAL_TABLET | ORAL | Status: DC | PRN
  Administered 2014-02-28: 4 mg via ORAL
  Filled 2014-02-24: qty 1

## 2014-02-24 NOTE — Consult Note (Signed)
Renal Service Consult Note [facility]  [facility]cc[facility] 02[facility]24/2014 Aranda Bihm D Requesting Physician:  Dr Rhona Leavenshiu  Reason for Consult:  Acute renal failure HPI: The patient is a 19 y.o. year-old with no specific PMH presented to ED on 2/19 with N/V and dry heaves. Also had some chest pain.  +EtOH. BP in ED was low and he may have passed out briefly in ED.  CT showed large LUQ mass of unclear etiology. wBC 16k.  Pt was admitted.    Creat was 0.88 on admission, then up to 2.37 on 2/21 and 2.71 today.  He did get IV contrast 100 cc w CT on 2/19.  Also rec'd Zosyn IV x 4 days, Vanc IV x 2 days stopped yest.  Rec'd Toradol x 2 prn.   Making urine per patient, nothing recorded last 24 hrs.   Patient no compalints. No SOB, cough, N/V/D, no abd pain.    Biopsy of abd mass according to patient showed "blood clot".   Past Medical History  Past Medical History  Diagnosis Date  . ADHD (attention deficit hyperactivity disorder)    Past Surgical History  Past Surgical History  Procedure Laterality Date  . Knee surgery    . Abdominal surgery      when infant   Family History History reviewed. No pertinent family history. Social History  reports that he has been smoking Cigars.  He does not have any smokeless tobacco history on file. He reports that he drinks alcohol. He reports that he uses illicit drugs (Marijuana). Allergies No Known Allergies Home medications Prior to Admission medications   Not on File   Liver Function Tests  Recent Labs Lab 02/20/14 1049 02/23/14 0500  AST 27 16  ALT 27 17  ALKPHOS 97 62  BILITOT 0.4 0.9  PROT 7.1 5.6*  ALBUMIN 4.4 3.1*    Recent Labs Lab 02/20/14 1049  LIPASE 28   CBC  Recent Labs Lab 02/20/14 1049  02/22/14 0500  02/22/14 1708 02/23/14 0500 02/24/14 0449  WBC 16.5*  < > 7.8  --   --  8.5 8.4  NEUTROABS 13.3*  --   --   --   --  5.5  --   HGB 14.1  < > 7.6*  < > 7.9* 7.4* 7.3*  HCT 41.4  < > 22.6*  <  > 23.5*  23.4* 22.2* 21.6*  MCV 91.2  < > 91.5  --   --  90.2 90.8  PLT 282  < > 172  --   --  163 176  < > = values in this interval not displayed. Basic Metabolic Panel  Recent Labs Lab 02/20/14 1049 02/22/14 0500 02/23/14 0500 02/23/14 0920 02/24/14 0449  NA 136 138 138 140 135  K 3.9 3.5 3.7 3.6 3.6  CL 104 104 106 109 105  CO2 24 25 26 25 24   GLUCOSE 167* 117* 90 97 94  BUN 14 6 9 9 10   CREATININE 0.88 0.87 2.37* 2.39* 2.71*  CALCIUM 9.3 8.4 8.5 8.5 8.3*    Filed Vitals:   02/23/14 0852 02/23/14 1521 02/23/14 2100 02/24/14 0541  BP: 123/55 136/75 143/70 135/69  Pulse: 77 99 60 73  Temp:  98.5 F (36.9 C) 98.9 F (37.2 C) 98.2 F (36.8 C)  TempSrc:  Oral Oral Oral  Resp: 18 20 18 18   Height:      Weight:    77 kg (169 lb 12.1 oz)  SpO2: 100% 99% 100%  100%   Exam Alert, oriented, no distress No rash, cyanosis or gangrene Sclera anicteric, throat clear No jvd Chest clear bilat RRR no MRG Abd soft, NTND, no mass or ascites No LE or UE edema Neuro is nf, Ox 3  UA 2/19 negative   Assessment: 1. Acute renal failure from combination IV contrast/ NSAID's - nonoliguric, euvolemic on exam. Expect recovery soon.  Will follow.  2. Abd pain / abd hematoma    Plan- repeat UA, cont IVF's, urine Na/ Cr  Vinson Moselle MD (pgr) [phone]    (c) [phone] 02/24/2014, 11:19 AM

## 2014-02-24 NOTE — Progress Notes (Signed)
Subjective: He had some nausea and threw up just before I walked in.  He feels better now.  Still some tenderness lower abdomen, but improving.  He looks fine.  Objective: Vital signs in last 24 hours: Temp:  [98.2 F (36.8 C)-98.9 F (37.2 C)] 98.2 F (36.8 C) (02/23 0541) Pulse Rate:  [60-99] 73 (02/23 0541) Resp:  [11-20] 18 (02/23 0541) BP: (121-143)/(53-75) 135/69 mmHg (02/23 0541) SpO2:  [99 %-100 %] 100 % (02/23 0541) Weight:  [77 kg (169 lb 12.1 oz)] 77 kg (169 lb 12.1 oz) (02/23 0541) Last BM Date: 02/22/14 600 PO Urine volume not recorded. 1 BM recorded Regular diet Afebrile, VSS H/H is stable Bmp is pending Renal US:  1. No hydronephrosis. 2. Increased renal parenchymal echogenicity bilaterally consistent with medical renal disease. 3. Small amount of ascites. 4. No other abnormalities.  Intake/Output from previous day: 02/22 0701 - 02/23 0700 In: 3427.5 [P.O.:600; I.V.:2677.5; IV Piggyback:150] Out: -  Intake/Output this shift:    General appearance: alert, cooperative and no distress Resp: clear to auscultation bilaterally GI: soft, still a bit sore, no distension, + BS, +BM, IR site looks fine.  Lab Results:   Recent Labs  02/22/14 0500  02/22/14 1708 02/23/14 0500  WBC 7.8  --   --  8.5  HGB 7.6*  < > 7.9* 7.4*  HCT 22.6*  < > 23.5*  23.4* 22.2*  PLT 172  --   --  163  < > = values in this interval not displayed.  BMET  Recent Labs  02/23/14 0500 02/23/14 0920  NA 138 140  K 3.7 3.6  CL 106 109  CO2 26 25  GLUCOSE 90 97  BUN 9 9  CREATININE 2.37* 2.39*  CALCIUM 8.5 8.5   PT/INR  Recent Labs  02/22/14 0500  LABPROT 15.0  INR 1.17     Recent Labs Lab 02/20/14 1049 02/23/14 0500  AST 27 16  ALT 27 17  ALKPHOS 97 62  BILITOT 0.4 0.9  PROT 7.1 5.6*  ALBUMIN 4.4 3.1*     Lipase     Component Value Date/Time   LIPASE 28 02/20/2014 1049     Studies/Results: [place] Renal  02/23/2014   CLINICAL DATA:  Acute renal  failure.  EXAM: RENAL/URINARY TRACT ULTRASOUND COMPLETE  COMPARISON:  None.  FINDINGS: Right Kidney:  Length: 11.5 cm. Increased parenchymal echogenicity. No renal mass or stone. No hydronephrosis.  Left Kidney:  Length: 12.0 cm. Increased parenchymal echogenicity. No renal mass or stone. No hydronephrosis.  Bladder:  Appears normal for degree of bladder distention.  Small amount of ascites is seen along the inferior margin of the liver.  IMPRESSION: 1. No hydronephrosis. 2. Increased renal parenchymal echogenicity bilaterally consistent with medical renal disease. 3. Small amount of ascites. 4. No other abnormalities.   Electronically Signed   By: Amie Portlandavid  Ormond M.D.   On: 02/23/2014 12:09   Ct Biopsy  02/23/2014   CLINICAL DATA:  Left upper quadrant mass  EXAM: CT GUIDED CORE BIOPSY OF LEFT UPPER ABDOMINAL MASS  ANESTHESIA/SEDATION: Intravenous Fentanyl and Versed were administered as conscious sedation during continuous cardiorespiratory monitoring by the radiology RN, with a total moderate sedation time of 10 minutes.  PROCEDURE: The procedure risks, benefits, and alternatives were explained to the patient. Questions regarding the procedure were encouraged and answered. The patient understands and consents to the procedure.  Select axial scans through the upper abdomen were obtained. The mass was localized and an appropriate skin  entry site was determined and marked.  The operative field was prepped with Betadinein a sterile fashion, and a sterile drape was applied covering the operative field. A sterile gown and sterile gloves were used for the procedure. Local anesthesia was provided with 1% Lidocaine.  Under CT fluoroscopic guidance, a 17 gauge trocar needle was advanced to the margin of the lesion. Once needle tip position was confirmed, coaxial 18-gauge core biopsy samples were obtained, submitted in formalin to surgical pathology. The guide needle was removed. Postprocedure scans show no hemorrhage or  other apparent complication. The patient tolerated the procedure well.  COMPLICATIONS: None immediate  FINDINGS: Limited CT scanning again demonstrated hyperdense mass external to the greater curvature of the stomach, anterior to the body and tail of the pancreas. Percutaneous core biopsy samples were obtained.  IMPRESSION: 1. Technically successful CT-guided core biopsy of left upper abdominal mass.   Electronically Signed   By: Corlis Leak M.D.   On: 02/23/2014 09:48    Medications: . ferrous sulfate  325 mg Oral Q breakfast  . piperacillin-tazobactam (ZOSYN)  IV  3.375 g Intravenous Q8H    Assessment/Plan 1. 15 cm LUQ abdominal mass of uncertain etiology Biopsy on 02/23/14 reported by Dr. Deanne Coffer as blood, consistent with a spontaneous intraperitoneal hemorrhage.  Path is still pending 2. SIRS 3. Syncope/BRADYCARDIA in the ED 4. New Anemia 5. Positive fecal occult 6. ADHD 7. 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 Elevated creatinine 9. SCD's for DVT prophylaxis.  No anticoagulant because of the bleed.   Plan:  I think he needs some time to get over renal issues and rebuild his H/H.  We will need path to be certain this is really blood and not something else.  He notes straining and coughing hard, trying to move some thick phlegm when he had the pop.  Will continue to follow with you.  He will need someone to follow up after discharge.    LOS: 4 days    Ranette Luckadoo 02/24/2014

## 2014-02-24 NOTE — Progress Notes (Signed)
Patient is requesting Phenergan instead of Compazine for Nausea control. PCP on call was notified.

## 2014-02-24 NOTE — Progress Notes (Signed)
[facility] PROGRESS NOTE  Omar MagnusonDustin Mccoy Sheppard ZOX:[id number] DOB: 12/28/1995 DOA: 02/20/2014 PCP: Cresenciano LickBENNETT-CAIN, ANDREA, MD  Assessment/Plan: 1. Abdominal mass with abd pain 1. 15.3cm L upper abd mass noted on imaging 2. General Surgery following 3. Pt underwent biopsy on 2/22 with results of blood clot as of 2/23 4. Hgb holding stable 5. No signs of further bleed per General Surgery with recs for repeat CT in 6 weeks to r/o underlying lesion 2. SIRS 1. Unclear etiology 2. WBC now normalized to 7.8k 3. UA is neg for UTI 4. Blood cultures thus far w/o growth, still pending final results 5. Was continued on zosyn and vanc. Vanc d/c'd per below 3. Acute renal failure 1. New finding as of 2/23 2. Renal US with no hydronephrosis 3. Have d/c'd toradol and vancomycin 4. Increased IVF as tolerated 5. Pt reports continued good urine output 6. Renal function worsened today. 7. Have consulted Nephrology. Appreciate input 8. Continue to follow renal function 4. Syncope/bradycardia 1. No further occurrence  5. DVT prophylaxis 1. SCD's 6. Anemia 1. Hgb drop from around 11.1 to 7.6 from 2/19 to 2/21 2. Stools are occult positive 3. Iron low at 18 4. Have added oral iron 5. Given anemia, had discussed case with GI on 2/21. See note from that day. 6. Suspect anemia is secondary to blood loss into abd cavity per above  Code Status: Full Family Communication: Pt in room, family at bedside Disposition Plan: Pending   Consultants:  General Surgery  IR  Discussed case with GI over phone  Procedures:    Antibiotics:  Zosyn 2/19>>> (indicate start date, and stop date if known)  Vancomycin 2/19>>>2/22  HPI/Subjective: Complaining of nausea with abd discomfort. No acute issues overnight  Objective: Filed Vitals:   02/23/14 1521 02/23/14 2100 02/24/14 0541 02/24/14 1422  BP: 136/75 143/70 135/69 144/89  Pulse: 99 60 73 67  Temp: 98.5 F (36.9 C) 98.9 F (37.2 C)  98.2 F (36.8 C) 98.5 F (36.9 C)  TempSrc: Oral Oral Oral Oral  Resp: 20 18 18 19   Height:      Weight:   77 kg (169 lb 12.1 oz)   SpO2: 99% 100% 100% 100%    Intake/Output Summary (Last 24 hours) at 02/24/14 1814 Last data filed at 02/24/14 0848  Gross per 24 hour  Intake 2467.5 ml  Output      0 ml  Net 2467.5 ml   Filed Weights   02/22/14 0438 02/23/14 0640 02/24/14 0541  Weight: 76.6 kg (168 lb 14 oz) 76 kg (167 lb 8.8 oz) 77 kg (169 lb 12.1 oz)    Exam:   General:  Awake, laying in bed, in nad  Cardiovascular: regular, s1, s2  Respiratory: normal resp effort, no wheezing  Abdomen: soft, nondistended  Musculoskeletal: perfused, no clubbing   Data Reviewed: Basic Metabolic Panel:  Recent Labs Lab 02/20/14 1049 02/22/14 0500 02/23/14 0500 02/23/14 0920 02/24/14 0449  NA 136 138 138 140 135  K 3.9 3.5 3.7 3.6 3.6  CL 104 104 106 109 105  CO2 24 25 26 25 24   GLUCOSE 167* 117* 90 97 94  BUN 14 6 9 9 10   CREATININE 0.88 0.87 2.37* 2.39* 2.71*  CALCIUM 9.3 8.4 8.5 8.5 8.3*  MG  --  1.7  --   --   --    Liver Function Tests:  Recent Labs Lab 02/20/14 1049 02/23/14 0500  AST 27 16  ALT 27 17  ALKPHOS 97  62  BILITOT 0.4 0.9  PROT 7.1 5.6*  ALBUMIN 4.4 3.1*    Recent Labs Lab 02/20/14 1049  LIPASE 28   No results for input(s): AMMONIA in the last 168 hours. CBC:  Recent Labs Lab 02/20/14 1049 02/20/14 1725 02/22/14 0500 02/22/14 0842 02/22/14 1310 02/22/14 1708 02/23/14 0500 02/24/14 0449  WBC 16.5* 19.4* 7.8  --   --   --  8.5 8.4  NEUTROABS 13.3*  --   --   --   --   --  5.5  --   HGB 14.1 11.1* 7.6* 7.4* 7.6* 7.9* 7.4* 7.3*  HCT 41.4 32.9* 22.6* 22.1* 22.6* 23.5*  23.4* 22.2* 21.6*  MCV 91.2 90.4 91.5  --   --   --  90.2 90.8  PLT 282 248 172  --   --   --  163 176   Cardiac Enzymes:  Recent Labs Lab 02/20/14 2340 02/21/14 0538  TROPONINI <0.03 <0.03   BNP (last 3 results)  Recent Labs  02/20/14 1743  BNP 31.5     ProBNP (last 3 results) No results for input(s): PROBNP in the last 8760 hours.  CBG:  Recent Labs Lab 02/21/14 0813 02/22/14 0851 02/23/14 0944 02/24/14 0732  GLUCAP 109* 90 90 102*    Recent Results (from the past 240 hour(s))  Culture, Urine     Status: None   Collection Time: 02/20/14  5:48 PM  Result Value Ref Range Status   Specimen Description URINE, CLEAN CATCH  Final   Special Requests NONE  Final   Colony Count   Final    10,000 COLONIES/ML Performed at [facility]    Culture   Final    DIPHTHEROIDS(CORYNEBACTERIUM SPECIES) Note: Standardized susceptibility testing for this organism is not available. Performed at [facility]    Report Status 02/22/2014 FINAL  Final  Culture, blood (routine x 2)     Status: None (Preliminary result)   Collection Time: 02/20/14  6:06 PM  Result Value Ref Range Status   Specimen Description BLOOD RIGHT HAND  5 ML IN [facility] BOTTLE  Final   Special Requests NONE  Final   Culture   Final           BLOOD CULTURE RECEIVED NO GROWTH TO DATE CULTURE WILL BE HELD FOR 5 DAYS BEFORE ISSUING A FINAL NEGATIVE REPORT Performed at [facility]    Report Status PENDING  Incomplete  Culture, blood (routine x 2)     Status: None (Preliminary result)   Collection Time: 02/20/14  6:17 PM  Result Value Ref Range Status   Specimen Description BLOOD LEFT HAND  3 ML IN [facility]  BOTTLE  Final   Special Requests NONE  Final   Culture   Final           BLOOD CULTURE RECEIVED NO GROWTH TO DATE CULTURE WILL BE HELD FOR 5 DAYS BEFORE ISSUING A FINAL NEGATIVE REPORT Performed at [facility]    Report Status PENDING  Incomplete  MRSA PCR Screening     Status: None   Collection Time: 02/20/14  7:57 PM  Result Value Ref Range Status   MRSA by PCR NEGATIVE NEGATIVE Final    Comment:        The GeneXpert MRSA Assay (FDA approved for NASAL specimens only), is one component of a comprehensive MRSA  colonization surveillance program. It is not intended to diagnose MRSA infection nor to guide or monitor treatment for MRSA infections.  Studies: US Renal  02/23/2014   CLINICAL DATA:  Acute renal failure.  EXAM: RENAL/URINARY TRACT ULTRASOUND COMPLETE  COMPARISON:  None.  FINDINGS: Right Kidney:  Length: 11.5 cm. Increased parenchymal echogenicity. No renal mass or stone. No hydronephrosis.  Left Kidney:  Length: 12.0 cm. Increased parenchymal echogenicity. No renal mass or stone. No hydronephrosis.  Bladder:  Appears normal for degree of bladder distention.  Small amount of ascites is seen along the inferior margin of the liver.  IMPRESSION: 1. No hydronephrosis. 2. Increased renal parenchymal echogenicity bilaterally consistent with medical renal disease. 3. Small amount of ascites. 4. No other abnormalities.   Electronically Signed   By: Amie Portland M.D.   On: 02/23/2014 12:09   Ct Biopsy  02/23/2014   CLINICAL DATA:  Left upper quadrant mass  EXAM: CT GUIDED CORE BIOPSY OF LEFT UPPER ABDOMINAL MASS  ANESTHESIA/SEDATION: Intravenous Fentanyl and Versed were administered as conscious sedation during continuous cardiorespiratory monitoring by the radiology RN, with a total moderate sedation time of 10 minutes.  PROCEDURE: The procedure risks, benefits, and alternatives were explained to the patient. Questions regarding the procedure were encouraged and answered. The patient understands and consents to the procedure.  Select axial scans through the upper abdomen were obtained. The mass was localized and an appropriate skin entry site was determined and marked.  The operative field was prepped with Betadinein a sterile fashion, and a sterile drape was applied covering the operative field. A sterile gown and sterile gloves were used for the procedure. Local anesthesia was provided with 1% Lidocaine.  Under CT fluoroscopic guidance, a 17 gauge trocar needle was advanced to the margin of the lesion.  Once needle tip position was confirmed, coaxial 18-gauge core biopsy samples were obtained, submitted in formalin to surgical pathology. The guide needle was removed. Postprocedure scans show no hemorrhage or other apparent complication. The patient tolerated the procedure well.  COMPLICATIONS: None immediate  FINDINGS: Limited CT scanning again demonstrated hyperdense mass external to the greater curvature of the stomach, anterior to the body and tail of the pancreas. Percutaneous core biopsy samples were obtained.  IMPRESSION: 1. Technically successful CT-guided core biopsy of left upper abdominal mass.   Electronically Signed   By: Corlis Leak M.D.   On: 02/23/2014 09:48    Scheduled Meds: . ferrous sulfate  325 mg Oral Q breakfast  . piperacillin-tazobactam (ZOSYN)  IV  3.375 g Intravenous Q8H   Continuous Infusions: . sodium chloride 125 mL/hr at 02/24/14 1415    Principal Problem:   Abdominal mass Active Problems:   Syncope   Bradycardia   Sepsis   CHIU, STEPHEN K  [facility] Pager [phone]. If 7PM-7AM, please contact night-coverage at [email or web address], password [facility] 02/24/2014, 6:14 PM  LOS: 4 days

## 2014-02-25 LAB — BASIC METABOLIC PANEL
Anion gap: 8 (ref 5–15)
BUN: 13 mg/dL (ref 6–23)
CO2: 23 mmol/L (ref 19–32)
CREATININE: 2.75 mg/dL — AB (ref 0.50–1.35)
Calcium: 8.5 mg/dL (ref 8.4–10.5)
Chloride: 108 mmol/L (ref 96–112)
GFR calc Af Amer: 37 mL/min — ABNORMAL LOW (ref >90)
GFR calc non Af Amer: 32 mL/min — ABNORMAL LOW (ref >90)
Glucose, Bld: 105 mg/dL — ABNORMAL HIGH (ref 70–99)
Potassium: 4.1 mmol/L (ref 3.5–5.1)
Sodium: 139 mmol/L (ref 135–145)

## 2014-02-25 LAB — CBC
HEMATOCRIT: 22.7 % — AB (ref 39.0–52.0)
Hemoglobin: 7.7 g/dL — ABNORMAL LOW (ref 13.0–17.0)
MCH: 30.7 pg (ref 26.0–34.0)
MCHC: 33.9 g/dL (ref 30.0–36.0)
MCV: 90.4 fL (ref 78.0–100.0)
PLATELETS: 189 10*3/uL (ref 150–400)
RBC: 2.51 MIL/uL — AB (ref 4.22–5.81)
RDW: 13.7 % (ref 11.5–15.5)
WBC: 9 10*3/uL (ref 4.0–10.5)

## 2014-02-25 LAB — GLUCOSE, CAPILLARY: Glucose-Capillary: 92 mg/dL (ref 70–99)

## 2014-02-25 NOTE — Progress Notes (Signed)
[facility] PROGRESS NOTE  Omar MagnusonDustin Mccoy Sheppard WUJ:[id number] DOB: 02/13/1995 DOA: 02/20/2014 PCP: Cresenciano LickBENNETT-CAIN, ANDREA, MD  Assessment/Plan: 1. Abdominal mass with abd pain 1. 15.3cm L upper abd mass noted on imaging 2. General Surgery following 3. Pt underwent biopsy on 2/22 with results of blood clot as of 2/23. Pathology demonstrates mature adipose tissue and blood clot. 4. Hgb holding stable in the 7.5 range 5. No signs of further bleed per General Surgery with recs for repeat CT in 6 weeks to r/o underlying lesion 2. SIRS 1. Unclear etiology 2. WBC now normalized to 7.8k 3. UA is neg for UTI 4. Blood cultures thus far w/o growth, still pending final results 5. Was on Zosyn and vancomycin. DC'd vancomycin before. DC Zosyn 2/24 and monitor off antibiotics. 3. Acute renal failure 1. New finding as of 2/23 2. Renal US with no hydronephrosis 3. As per nephrology: Etiology ATN-from IV contrast/volume depletion/NSAIDs. Creatinine has stabilized. Nonoliguric. IV fluids being decreased. Follow BMP 4. Syncope/bradycardia 1. No further occurrence. DC telemetry.  5. DVT prophylaxis 1. SCD's 6. Anemia 1. Hgb drop from around 11.1 to 7.6 from 2/19 to 2/21 2. Stools are occult positive 3. Iron low at 18 4. Have added oral iron 5. Given anemia, Dr. Narda RutherfordSteven Chiu discussed case with GI on 2/21. See note from that day. 6. Suspect anemia is secondary to blood loss into abd cavity per above. Patient denies hematemesis, melena or rectal bleeding.  Code Status: Full Family Communication: None at bedside Disposition Plan: Pending improvement in renal functions   Consultants:  General Surgery  IR  Dr. Narda RutherfordSteven Chiu discussed case with GI over phone  Nephrology  Procedures:  None  Antibiotics:  Zosyn 2/19>>> 2/24  Vancomycin 2/19>>>2/22  HPI/Subjective: Appetite improving. Mild low back and mid abdominal pain-states due to lying in bed all the time. Having  BMs.  Objective: Filed Vitals:   02/24/14 1422 02/24/14 2114 02/25/14 0502 02/25/14 1352  BP: 144/89 135/82 138/81 143/70  Pulse: 67 67 61 79  Temp: 98.5 F (36.9 C) 99.3 F (37.4 C) 98.7 F (37.1 C) 99 F (37.2 C)  TempSrc: Oral Oral Oral Oral  Resp: 19 18 18 20   Height:      Weight:   79.5 kg (175 lb 4.3 oz)   SpO2: 100% 100% 97% 96%    Intake/Output Summary (Last 24 hours) at 02/25/14 1852 Last data filed at 02/25/14 1700  Gross per 24 hour  Intake 4177.5 ml  Output    300 ml  Net 3877.5 ml   Filed Weights   02/23/14 0640 02/24/14 0541 02/25/14 0502  Weight: 76 kg (167 lb 8.8 oz) 77 kg (169 lb 12.1 oz) 79.5 kg (175 lb 4.3 oz)    Exam:   General:  Awake, laying in bed, in nad  Cardiovascular: regular, s1, s2  Respiratory: normal resp effort, no wheezing  Abdomen: soft, nondistended  Musculoskeletal: perfused, no clubbing   Data Reviewed: Basic Metabolic Panel:  Recent Labs Lab 02/22/14 0500 02/23/14 0500 02/23/14 0920 02/24/14 0449 02/25/14 0522  NA 138 138 140 135 139  K 3.5 3.7 3.6 3.6 4.1  CL 104 106 109 105 108  CO2 25 26 25 24 23   GLUCOSE 117* 90 97 94 105*  BUN 6 9 9 10 13   CREATININE 0.87 2.37* 2.39* 2.71* 2.75*  CALCIUM 8.4 8.5 8.5 8.3* 8.5  MG 1.7  --   --   --   --    Liver Function Tests:  Recent Labs Lab 02/20/14 1049 02/23/14 0500  AST 27 16  ALT 27 17  ALKPHOS 97 62  BILITOT 0.4 0.9  PROT 7.1 5.6*  ALBUMIN 4.4 3.1*    Recent Labs Lab 02/20/14 1049  LIPASE 28   No results for input(s): AMMONIA in the last 168 hours. CBC:  Recent Labs Lab 02/20/14 1049 02/20/14 1725 02/22/14 0500  02/22/14 1310 02/22/14 1708 02/23/14 0500 02/24/14 0449 02/25/14 0522  WBC 16.5* 19.4* 7.8  --   --   --  8.5 8.4 9.0  NEUTROABS 13.3*  --   --   --   --   --  5.5  --   --   HGB 14.1 11.1* 7.6*  < > 7.6* 7.9* 7.4* 7.3* 7.7*  HCT 41.4 32.9* 22.6*  < > 22.6* 23.5*  23.4* 22.2* 21.6* 22.7*  MCV 91.2 90.4 91.5  --   --   --  90.2  90.8 90.4  PLT 282 248 172  --   --   --  163 176 189  < > = values in this interval not displayed. Cardiac Enzymes:  Recent Labs Lab 02/20/14 2340 02/21/14 0538  TROPONINI <0.03 <0.03   BNP (last 3 results)  Recent Labs  02/20/14 1743  BNP 31.5    ProBNP (last 3 results) No results for input(s): PROBNP in the last 8760 hours.  CBG:  Recent Labs Lab 02/21/14 0813 02/22/14 0851 02/23/14 0944 02/24/14 0732 02/25/14 0744  GLUCAP 109* 90 90 102* 92    Recent Results (from the past 240 hour(s))  Culture, Urine     Status: None   Collection Time: 02/20/14  5:48 PM  Result Value Ref Range Status   Specimen Description URINE, CLEAN CATCH  Final   Special Requests NONE  Final   Colony Count   Final    10,000 COLONIES/ML Performed at [facility]    Culture   Final    DIPHTHEROIDS(CORYNEBACTERIUM SPECIES) Note: Standardized susceptibility testing for this organism is not available. Performed at [facility]    Report Status 02/22/2014 FINAL  Final  Culture, blood (routine x 2)     Status: None (Preliminary result)   Collection Time: 02/20/14  6:06 PM  Result Value Ref Range Status   Specimen Description BLOOD RIGHT HAND  5 ML IN [facility] BOTTLE  Final   Special Requests NONE  Final   Culture   Final           BLOOD CULTURE RECEIVED NO GROWTH TO DATE CULTURE WILL BE HELD FOR 5 DAYS BEFORE ISSUING A FINAL NEGATIVE REPORT Performed at [facility]    Report Status PENDING  Incomplete  Culture, blood (routine x 2)     Status: None (Preliminary result)   Collection Time: 02/20/14  6:17 PM  Result Value Ref Range Status   Specimen Description BLOOD LEFT HAND  3 ML IN [facility]  BOTTLE  Final   Special Requests NONE  Final   Culture   Final           BLOOD CULTURE RECEIVED NO GROWTH TO DATE CULTURE WILL BE HELD FOR 5 DAYS BEFORE ISSUING A FINAL NEGATIVE REPORT Performed at [facility]    Report Status PENDING  Incomplete  MRSA PCR Screening      Status: None   Collection Time: 02/20/14  7:57 PM  Result Value Ref Range Status   MRSA by PCR NEGATIVE NEGATIVE Final    Comment:  The GeneXpert MRSA Assay (FDA approved for NASAL specimens only), is one component of a comprehensive MRSA colonization surveillance program. It is not intended to diagnose MRSA infection nor to guide or monitor treatment for MRSA infections.      Studies: No results found.  Scheduled Meds: . ferrous sulfate  325 mg Oral Q breakfast  . piperacillin-tazobactam (ZOSYN)  IV  3.375 g Intravenous Q8H   Continuous Infusions: . sodium chloride 125 mL/hr (02/25/14 1419)    Principal Problem:   Abdominal mass Active Problems:   Syncope   Bradycardia   Sepsis   Time spent: 20 minutes  Izela Altier, MD, FACP, FHM. [facility] Pager [phone]  If 7PM-7AM, please contact night-coverage [email or web address] Password [facility] 02/25/2014, 6:57 PM    LOS: 5 days

## 2014-02-25 NOTE — Plan of Care (Signed)
Problem: Phase I Progression Outcomes Goal: Other Phase I Outcomes/Goals Outcome: Completed/Met Date Met:  02/25/14 Patient had a biopsy (LUQ)

## 2014-02-25 NOTE — Progress Notes (Signed)
Dr. Waymon AmatoHongalgi ask me about getting GI to see pt formally.  I discussed with Dr. Abbey Chattersosenbower.  He agrees with repeating fecal occult and if it is positive he would recommend GI evaluation.

## 2014-02-25 NOTE — Progress Notes (Addendum)
  [facility] Progress Note   Subjective: Feeling good, not recording urine but making good amounts per pt  Filed Vitals:   02/24/14 1422 02/24/14 2114 02/25/14 0502 02/25/14 1352  BP: 144/89 135/82 138/81 143/70  Pulse: 67 67 61 79  Temp: 98.5 F (36.9 C) 99.3 F (37.4 C) 98.7 F (37.1 C) 99 F (37.2 C)  TempSrc: Oral Oral Oral Oral  Resp: 19 18 18 20   Height:      Weight:   79.5 kg (175 lb 4.3 oz)   SpO2: 100% 100% 97% 96%   Exam: Alert, no distress Multiple tattoos No jvd Chest clear bilat RRR no MRG Abd soft, NTND, no mass or HSM No LE edema Neuro is alert, nf, Ox 3  Renal US ^'d echo c/w medical renal disease, o/w normal US UA 2/19 negative UA 2/23 - 1.009, 6.0, neg protein, 0-2 WBC/RBC per HPF UNa 86, UCr 76       Assessment: 1. Acute kidney injury / ATN -from IV contrast/ vol depletion / NSAID's; renal function may be starting to improve 2. Abdominal mass - s/p biopsy showed blood clot and adipose tissue, unclear origin 3. Volume - is euvolemic, BP good, walking in halls 4. SIRS - cx's negative, WBC now normal, on Zosyn 5. Anemia - Hb drop to 7's, +stool for blood; per primary, poss ABL into abd cavity  Plan- dec IVF 100/hr, labs in am    Vinson Moselleob Waynetta Metheny MD  pager [phone]    cell [phone]  02/25/2014, 6:14 PM     Recent Labs Lab 02/23/14 0920 02/24/14 0449 02/25/14 0522  NA 140 135 139  K 3.6 3.6 4.1  CL 109 105 108  CO2 25 24 23   GLUCOSE 97 94 105*  BUN 9 10 13   CREATININE 2.39* 2.71* 2.75*  CALCIUM 8.5 8.3* 8.5    Recent Labs Lab 02/20/14 1049 02/23/14 0500  AST 27 16  ALT 27 17  ALKPHOS 97 62  BILITOT 0.4 0.9  PROT 7.1 5.6*  ALBUMIN 4.4 3.1*    Recent Labs Lab 02/20/14 1049  02/23/14 0500 02/24/14 0449 02/25/14 0522  WBC 16.5*  < > 8.5 8.4 9.0  NEUTROABS 13.3*  --  5.5  --   --   HGB 14.1  < > 7.4* 7.3* 7.7*  HCT 41.4  < > 22.2* 21.6* 22.7*  MCV 91.2  < > 90.2 90.8 90.4  PLT 282  < > 163 176 189  < > =  values in this interval not displayed. . ferrous sulfate  325 mg Oral Q breakfast  . piperacillin-tazobactam (ZOSYN)  IV  3.375 g Intravenous Q8H   . sodium chloride 125 mL/hr (02/25/14 1419)   acetaminophen **OR** acetaminophen, HYDROmorphone (DILAUDID) injection, ondansetron **OR** ondansetron (ZOFRAN) IV, promethazine

## 2014-02-25 NOTE — Progress Notes (Signed)
ANTIBIOTIC CONSULT NOTE - FOLLOW-UP  Pharmacy Consult for Zosyn Indication: rule out sepsis  No Known Allergies  Patient Measurements: Height:  (167.6 cm) Weight: 175 lb 4.3 oz (79.5 kg) IBW/kg (Calculated) : 63.8  Vital Signs: Temp: 98.7 F (37.1 C) (02/24 0502) Temp Source: Oral (02/24 0502) BP: 138/81 mmHg (02/24 0502) Pulse Rate: 61 (02/24 0502) Intake/Output from previous day: 02/23 0701 - 02/24 0700 In: 3677.5 [P.O.:840; I.V.:2687.5; IV Piggyback:150] Out: -  Intake/Output from this shift: Total I/O In: 240 [P.O.:240] Out: -   Labs:  Recent Labs  02/23/14 0500 02/23/14 0920 02/24/14 0449 02/24/14 1408 02/25/14 0522  WBC 8.5  --  8.4  --  9.0  HGB 7.4*  --  7.3*  --  7.7*  PLT 163  --  176  --  189  LABCREA  --   --   --  76.05  --   CREATININE 2.37* 2.39* 2.71*  --  2.75*   Estimated Creatinine Clearance: 42.5 mL/min (by C-G formula based on Cr of 2.75). No results for input(s): VANCOTROUGH, VANCOPEAK, VANCORANDOM, GENTTROUGH, GENTPEAK, GENTRANDOM, TOBRATROUGH, TOBRAPEAK, TOBRARND, AMIKACINPEAK, AMIKACINTROU, AMIKACIN in the last 72 hours.   Microbiology: Recent Results (from the past 720 hour(s))  Culture, Urine     Status: None   Collection Time: 02/20/14  5:48 PM  Result Value Ref Range Status   Specimen Description URINE, CLEAN CATCH  Final   Special Requests NONE  Final   Colony Count   Final    10,000 COLONIES/ML Performed at [facility]    Culture   Final    DIPHTHEROIDS(CORYNEBACTERIUM SPECIES) Note: Standardized susceptibility testing for this organism is not available. Performed at [facility]    Report Status 02/22/2014 FINAL  Final  Culture, blood (routine x 2)     Status: None (Preliminary result)   Collection Time: 02/20/14  6:06 PM  Result Value Ref Range Status   Specimen Description BLOOD RIGHT HAND  5 ML IN [facility] BOTTLE  Final   Special Requests NONE  Final   Culture   Final           BLOOD CULTURE  RECEIVED NO GROWTH TO DATE CULTURE WILL BE HELD FOR 5 DAYS BEFORE ISSUING A FINAL NEGATIVE REPORT Performed at [facility]    Report Status PENDING  Incomplete  Culture, blood (routine x 2)     Status: None (Preliminary result)   Collection Time: 02/20/14  6:17 PM  Result Value Ref Range Status   Specimen Description BLOOD LEFT HAND  3 ML IN [facility]  BOTTLE  Final   Special Requests NONE  Final   Culture   Final           BLOOD CULTURE RECEIVED NO GROWTH TO DATE CULTURE WILL BE HELD FOR 5 DAYS BEFORE ISSUING A FINAL NEGATIVE REPORT Performed at [facility]    Report Status PENDING  Incomplete  MRSA PCR Screening     Status: None   Collection Time: 02/20/14  7:57 PM  Result Value Ref Range Status   MRSA by PCR NEGATIVE NEGATIVE Final    Comment:        The GeneXpert MRSA Assay (FDA approved for NASAL specimens only), is one component of a comprehensive MRSA colonization surveillance program. It is not intended to diagnose MRSA infection nor to guide or monitor treatment for MRSA infections.     Medical History: Past Medical History  Diagnosis Date  . ADHD (attention deficit  hyperactivity disorder)     Medications:  Scheduled:  . ferrous sulfate  325 mg Oral Q breakfast  . piperacillin-tazobactam (ZOSYN)  IV  3.375 g Intravenous Q8H   Infusions:  . sodium chloride 125 mL/hr at 02/25/14 [id number]   PRN: acetaminophen **OR** acetaminophen, HYDROmorphone (DILAUDID) injection, ondansetron **OR** ondansetron (ZOFRAN) IV, promethazine  Assessment: 19 y/o M presented to ED 2/19 with dry heaves, epigastric and lower chest pain, and syncopal episode in the ED. Abdominal CT reveals a 15 cm abdominal mass of uncertain etiology. TR[facility] MD has consulted pharmacy to dose vancomycin and zosyn for sepsis, as patient found to be hypothermic, hypotensive, tachycardic, and tachypneic in ED.  2/19 >> Vancomycin >> 2/22 2/19 >> Zosyn >>  Significant events: 2/22 Scr increased to  2.37, Vancomycin and toradol discontinued. 2/23 Nephrology consulted 2/24 Scr 2.75  Goal of Therapy:  Zosyn dose per indication, renal function  Plan:   Continue Zosyn 3.375g IV q8h (infuse over 4 hours).  Follow up renal function & cultures, clinical course.  Follow up de-escalation and duration of therapy.   Arley Phenixllen Ioane Bhola RPh 02/25/2014, 12:36 PM Pager [phone]

## 2014-02-26 ENCOUNTER — Inpatient Hospital Stay (HOSPITAL_COMMUNITY): Payer: Medicaid Other

## 2014-02-26 ENCOUNTER — Encounter (HOSPITAL_COMMUNITY): Payer: Self-pay | Admitting: *Deleted

## 2014-02-26 ENCOUNTER — Encounter (HOSPITAL_COMMUNITY)
Admission: EM | Disposition: A | Discharge status: Home / Self Care | Payer: Self-pay | Source: Home / Self Care | Attending: Internal Medicine

## 2014-02-26 DIAGNOSIS — R1902 Left upper quadrant abdominal swelling, mass and lump: Secondary | ICD-10-CM | POA: Insufficient documentation

## 2014-02-26 DIAGNOSIS — D509 Iron deficiency anemia, unspecified: Secondary | ICD-10-CM

## 2014-02-26 DIAGNOSIS — R109 Unspecified abdominal pain: Secondary | ICD-10-CM

## 2014-02-26 DIAGNOSIS — R1012 Left upper quadrant pain: Secondary | ICD-10-CM

## 2014-02-26 DIAGNOSIS — E876 Hypokalemia: Secondary | ICD-10-CM

## 2014-02-26 DIAGNOSIS — R195 Other fecal abnormalities: Secondary | ICD-10-CM

## 2014-02-26 HISTORY — PX: ESOPHAGOGASTRODUODENOSCOPY: SHX5428

## 2014-02-26 LAB — CBC
HCT: 23.5 % — ABNORMAL LOW (ref 39.0–52.0)
Hemoglobin: 8.1 g/dL — ABNORMAL LOW (ref 13.0–17.0)
MCH: 31.3 pg (ref 26.0–34.0)
MCHC: 34.5 g/dL (ref 30.0–36.0)
MCV: 90.7 fL (ref 78.0–100.0)
Platelets: 202 10*3/uL (ref 150–400)
RBC: 2.59 MIL/uL — ABNORMAL LOW (ref 4.22–5.81)
RDW: 14 % (ref 11.5–15.5)
WBC: 9.7 10*3/uL (ref 4.0–10.5)

## 2014-02-26 LAB — BASIC METABOLIC PANEL WITH GFR
Anion gap: 4 — ABNORMAL LOW (ref 5–15)
BUN: 14 mg/dL (ref 6–23)
CO2: 22 mmol/L (ref 19–32)
Calcium: 8.1 mg/dL — ABNORMAL LOW (ref 8.4–10.5)
Chloride: 109 mmol/L (ref 96–112)
Creatinine, Ser: 2.59 mg/dL — ABNORMAL HIGH (ref 0.50–1.35)
GFR calc Af Amer: 39 mL/min — ABNORMAL LOW
GFR calc non Af Amer: 34 mL/min — ABNORMAL LOW
Glucose, Bld: 121 mg/dL — ABNORMAL HIGH (ref 70–99)
Potassium: 3.3 mmol/L — ABNORMAL LOW (ref 3.5–5.1)
Sodium: 135 mmol/L (ref 135–145)

## 2014-02-26 LAB — GLUCOSE, CAPILLARY: GLUCOSE-CAPILLARY: 107 mg/dL — AB (ref 70–99)

## 2014-02-26 LAB — CULTURE, BLOOD (ROUTINE X 2)
CULTURE: NO GROWTH
Culture: NO GROWTH

## 2014-02-26 SURGERY — EGD (ESOPHAGOGASTRODUODENOSCOPY)
Anesthesia: Moderate Sedation

## 2014-02-26 MED ORDER — MIDAZOLAM HCL 10 MG/2ML IJ SOLN
INTRAMUSCULAR | Status: AC
  Filled 2014-02-26: qty 2

## 2014-02-26 MED ORDER — FENTANYL CITRATE 0.05 MG/ML IJ SOLN
INTRAMUSCULAR | Status: DC | PRN
  Administered 2014-02-26 (×4): 25 ug via INTRAVENOUS

## 2014-02-26 MED ORDER — SODIUM CHLORIDE 0.9 % IV SOLN
INTRAVENOUS | Status: DC
  Administered 2014-02-26: 500 mL via INTRAVENOUS

## 2014-02-26 MED ORDER — PANTOPRAZOLE SODIUM 40 MG PO TBEC
40.0000 mg | DELAYED_RELEASE_TABLET | Freq: Every day | ORAL | Status: DC
  Administered 2014-02-26 – 2014-02-28 (×3): 40 mg via ORAL
  Filled 2014-02-26 (×5): qty 1

## 2014-02-26 MED ORDER — FENTANYL CITRATE 0.05 MG/ML IJ SOLN
INTRAMUSCULAR | Status: AC
  Filled 2014-02-26: qty 2

## 2014-02-26 MED ORDER — DIPHENHYDRAMINE HCL 50 MG/ML IJ SOLN
INTRAMUSCULAR | Status: DC | PRN
  Administered 2014-02-26 (×2): 25 mg via INTRAVENOUS

## 2014-02-26 MED ORDER — BUTAMBEN-TETRACAINE-BENZOCAINE 2-2-14 % EX AERO
INHALATION_SPRAY | CUTANEOUS | Status: DC | PRN
  Administered 2014-02-26: 2 via TOPICAL

## 2014-02-26 MED ORDER — DIPHENHYDRAMINE HCL 50 MG/ML IJ SOLN
INTRAMUSCULAR | Status: AC
  Filled 2014-02-26: qty 1

## 2014-02-26 MED ORDER — PROMETHAZINE HCL 25 MG/ML IJ SOLN
12.5000 mg | Freq: Four times a day (QID) | INTRAMUSCULAR | Status: AC | PRN
  Administered 2014-02-26: 12.5 mg via INTRAVENOUS
  Filled 2014-02-26 (×3): qty 1

## 2014-02-26 MED ORDER — MIDAZOLAM HCL 10 MG/2ML IJ SOLN
INTRAMUSCULAR | Status: DC | PRN
  Administered 2014-02-26 (×4): 2 mg via INTRAVENOUS

## 2014-02-26 MED ORDER — FERROUS SULFATE 325 (65 FE) MG PO TABS
325.0000 mg | ORAL_TABLET | Freq: Two times a day (BID) | ORAL | Status: DC
  Administered 2014-02-26 – 2014-02-28 (×4): 325 mg via ORAL
  Filled 2014-02-26 (×7): qty 1

## 2014-02-26 NOTE — Op Note (Signed)
[facility] [address] [place], [id number]   ENDOSCOPY PROCEDURE REPORT  PATIENT: Omar Sheppard, Omar Sheppard  MR#: [id number] BIRTHDATE: 07-Nov-1995 , 18  yrs. old GENDER: male ENDOSCOPIST: Meryl DareMalcolm T Warren Lindahl, MD, [facility] REFERRED BY:  [facility] PROCEDURE DATE:  02/26/2014 PROCEDURE:  EGD w/ biopsy ASA CLASS:     Class II INDICATIONS:  hemocult positive stool and iron deficiency anemia. MEDICATIONS: Benadryl 50 mg IV, Fentanyl 100 mcg IV, and Versed 8 mg IV TOPICAL ANESTHETIC: Cetacaine Spray DESCRIPTION OF PROCEDURE: After the risks benefits and alternatives of the procedure were thoroughly explained, informed consent was obtained.  The Pentax Gastroscope [id number] endoscope was introduced through the mouth and advanced to the second portion of the duodenum , Without limitations.  The instrument was slowly withdrawn as the mucosa was fully examined.  ESOPHAGUS: There was LA Class A esophagitis (One or more mucosal breaks < 5 mm in maximal length) noted. The esophagus was otherwise normal. STOMACH: Mild erosive gastritis was found in the gastric body. Multiple biopsies were performed.  The stomach otherwise appeared normal. DUODENUM: The duodenal mucosa showed no abnormalities in the bulb and 2nd part of the duodenum.  Cold forceps biopsies were taken in the bulb and second portion.  Retroflexed views revealed a small hiatal hernia.     The scope was then withdrawn from the patient and the procedure completed.  COMPLICATIONS: There were no immediate complications.  ENDOSCOPIC IMPRESSION: 1.   LA Class A esophagitis 2.   Erosive gastritis in the gastric body; multiple biopsies performed 3.   Small hiatal hernia  RECOMMENDATIONS: 1.  Anti-reflux regimen 2.  Await pathology results 3.  PPI qam 4.  Avoid ASA/NSAIDs for now 5.  Erosive gastritis/esophagitis could explain heme + stool however there is no clear explanation for Fe def anemia. Await  pathology and tTG. No further GI evaluation at this time. Continue Fe replacement and consider IV Fe-per hospitalist  eSigned:  Meryl DareMalcolm T Vianna Venezia, MD, [facility] 02/26/2014 1:39 PM

## 2014-02-26 NOTE — Interval H&P Note (Signed)
History and Physical Interval Note:  02/26/2014 12:55 PM  [facility]  [facility] today for [facility], with the diagnosis of nausea, anemia, heme positive stools  The various methods of treatment have been discussed with the patient and family. After consideration of risks, benefits and other options for treatment, the patient has consented to  Procedure(s): ESOPHAGOGASTRODUODENOSCOPY (EGD) (N/A) as a surgical intervention .  The patient's history has been reviewed, patient examined, no change in status, stable for surgery.  I have reviewed the patient's chart and labs.  Questions were answered to the patient's satisfaction.     Venita LickMalcolm T. Russella DarStark MD

## 2014-02-26 NOTE — H&P (View-Only) (Signed)
On February 22 he had a CT-guided biopsy of the abdominal mass in the left upper quadrant   Referring Provider: [facility] Primary Care Physician:  BENNETT-CAIN, ANDREA, MD Primary Gastroenterologist:  unassigned  Reason for Consultation:   Anemia, heme positive stool, nausea  HPI: Omar Sheppard is a 18 y.o. male after experiencing acute onset of upper abdominal pain in the epigastric area that started on Thursday. Patient reports that for the past 5-6 months he has felt nauseous every morning and dry heaves and occasionally vomited every morning. He feels fine for the rest of the day and because of this he felt he was having "sympathy nausea" as his fiance is pregnant with her first child and doing May. Approximately 3 weeks ago while at work, he slipped on a tractor trailer bed and hit his right lower left chest and abdominal area. He had immediate excruciating pain and felt like it knocked the wind out of him however the pain quickly subsided and he resumed his normal activities. He had had no increase in his morning nausea since this incident. On Thursday the 18th he dry heaved forcefully and felt a "pop" in the epigastric area and began to experience left upper quadrant pain which he felt was an 8 out of 10 in intensity. He had no associated change in bowel habits or stool caliber. He had no bright red blood per rectum or melena. He had no syncope or loss of consciousness. In the emergency room he was found to have an elevated white blood cell count of 16.5 And hemoglobin of 14.1. His creatinine was 0.88 and his BUN was 14. He had an abdominal CT that showed a 15.3 cm soft tissue lesion in the left upper abdomen, worrisome for tumor such as lymphoma or possibly sarcoma. Hematoma was considered less likely he also had associated moderate complex/high density abdominopelvic ascites no frank peritoneal nodularity no free air. He was evaluated by surgery and interventional radiology was  contacted for a biopsy. Pathology revealed mature adipose tissue and a blood clot. Patient hemoglobin has drifted to 7.3 and today is 7.7. He was found to have heme-positive stools 3 days ago. Due to his ongoing nausea and heme positive stools we have been asked to see him. Patient denies a family history of colon cancer, polyps, inflammatory bowel disease, gastric cancer or esophageal cancer. He has not had any odynophagia or dysphagia. His nausea is fairly constant but it is worse in the morning. It is not exacerbated nor alleviated with ingestion of food. His appetite has been good and his weight has been stable he denies early satiety. He denies use of NSAIDs, aspirin, Gioody powders, etc. He denies use of tobacco, alcohol, or intravenous drugs. Patient says his stools have been loose since he has been in the hospital. His creatinine went up to 2.37 on 221 and 2.7 on 2/23.  he was evaluated by urology who felt his acute kidney injury was likely due to IV contrast and Toradol. Patient has been making good amounts of urine.     Past Medical History  Diagnosis Date  . ADHD (attention deficit hyperactivity disorder)     Past Surgical History  Procedure Laterality Date  . Knee surgery    . Abdominal surgery      when infant    Prior to Admission medications   Not on File    Current Facility-Administered Medications  Medication Dose Route Frequency Provider Last Rate Last Dose  . 0.9 %    sodium chloride infusion   Intravenous Continuous Robert D Schertz, MD 100 mL/hr at 02/25/14 2200    . acetaminophen (TYLENOL) tablet 650 mg  650 mg Oral Q6H PRN Alma M Devine, MD   650 mg at 02/23/14 1224   Or  . acetaminophen (TYLENOL) suppository 650 mg  650 mg Rectal Q6H PRN Alma M Devine, MD      . ferrous sulfate tablet 325 mg  325 mg Oral Q breakfast Stephen K Chiu, MD   325 mg at 02/25/14 0743  . HYDROmorphone (DILAUDID) injection 0.5 mg  0.5 mg Intravenous Q4H PRN Stephen K Chiu, MD   0.5 mg at  02/26/14 0845  . ondansetron (ZOFRAN) tablet 4 mg  4 mg Oral Q4H PRN Stephen K Chiu, MD       Or  . ondansetron (ZOFRAN) injection 4 mg  4 mg Intravenous Q4H PRN Stephen K Chiu, MD   4 mg at 02/26/14 0845    Allergies as of 02/20/2014  . (No Known Allergies)    History reviewed. No pertinent family history.  History   Social History  . Marital Status: Single    Spouse Name: N/A  . Number of Children: N/A  . Years of Education: N/A   Occupational History  . Not on file.   Social History Main Topics  . Smoking status: Current Some Day Smoker    Types: Cigars  . Smokeless tobacco: Not on file  . Alcohol Use: Yes     Comment: occasionally  . Drug Use: Yes    Special: Marijuana     Comment: once a month  . Sexual Activity: Not on file   Other Topics Concern  . Not on file   Social History Narrative    Review of Systems: Gen: Denies any fever, chills, sweats, anorexia, fatigue, weakness, malaise, weight loss, and sleep disorder CV: Denies chest pain, angina, palpitations, syncope, orthopnea, PND, peripheral edema, and claudication. Resp: Denies dyspnea at rest, dyspnea with exercise, cough, sputum, wheezing, coughing up blood, and pleurisy. GI: Denies vomiting blood, jaundice, and fecal incontinence.   Denies dysphagia or odynophagia. GU : Denies urinary burning, blood in urine, urinary frequency, urinary hesitancy, nocturnal urination, and urinary incontinence. MS: Denies joint pain, limitation of movement, and swelling, stiffness, low back pain, extremity pain. Denies muscle weakness, cramps, atrophy.  Derm: Denies rash, itching, dry skin, hives, moles, warts, or unhealing ulcers.  Psych: Denies depression, anxiety, memory loss, suicidal ideation, hallucinations, paranoia, and confusion. Heme: Denies bruising, bleeding, and enlarged lymph nodes. Neuro:  Denies any headaches, dizziness, paresthesias. Endo:  Denies any problems with DM, thyroid, adrenal  function.  Physical Exam: Vital signs in last 24 hours: Temp:  [99 F (37.2 C)-99.2 F (37.3 C)] 99.2 F (37.3 C) (02/25 0524) Pulse Rate:  [31-79] 31 (02/25 0524) Resp:  [18-20] 18 (02/25 0524) BP: (139-158)/(70-90) 139/87 mmHg (02/25 0524) SpO2:  [96 %-100 %] 100 % (02/25 0524) Weight:  [178 lb 5.6 oz (80.9 kg)] 178 lb 5.6 oz (80.9 kg) (02/25 0524) Last BM Date: 02/25/14 General:   Alert,  Well-developed, well-nourished, pleasant and cooperative in NAD Head:  Normocephalic and atraumatic. Eyes:  Sclera clear, no icterus.   Conjunctiva pink. Ears:  Normal auditory acuity. Nose:  No deformity, discharge,  or lesions. Mouth:  No deformity or lesions.   Neck:  Supple; no masses or thyromegaly. Lungs:  Clear throughout to auscultation.   No wheezes, crackles, or rhonchi.  Heart:  Regular rate and rhythm; no   murmurs, clicks, rubs,  or gallops. Abdomen:  Soft,tender diffusely throughout with no rebound or guarding, BS active,nonpalp mass or hsm.   Rectal:  Scant brown stool, heme negative. Perianal area erythematous Msk:  Symmetrical without gross deformities. . Pulses:  Normal pulses noted. Extremities:  Without clubbing or edema. Neurologic:  Alert and  oriented x4;  grossly normal neurologically. Skin:  Intact without significant lesions or rashes.. Psych:  Alert and cooperative. Normal mood and affect.  Intake/Output from previous day: 02/24 0701 - 02/25 0700 In: 4730 [P.O.:1680; I.V.:3050] Out: 900 [Urine:900] Intake/Output this shift:    Lab Results:  Recent Labs  02/24/14 0449 02/25/14 0522 02/26/14 0440  WBC 8.4 9.0 9.7  HGB 7.3* 7.7* 8.1*  HCT 21.6* 22.7* 23.5*  PLT 176 189 202   on February 21 iron 18, TIBC 242, saturation ratio 7, vitamin B-12 564. Folate 1350.  BMET  Recent Labs  02/24/14 0449 02/25/14 0522 02/26/14 0440  NA 135 139 135  K 3.6 4.1 3.3*  CL 105 108 109  CO2 24 23 22  GLUCOSE 94 105* 121*  BUN 10 13 14  CREATININE 2.71* 2.75* 2.59*   CALCIUM 8.3* 8.5 8.1*      Studies/Results: Study Result     CLINICAL DATA: Abdominal pain, nausea/ vomiting  EXAM: CT ABDOMEN AND PELVIS WITH CONTRAST  TECHNIQUE: Multidetector CT imaging of the abdomen and pelvis was performed using the standard protocol following bolus administration of intravenous contrast.  CONTRAST: 100mL OMNIPAQUE IOHEXOL 300 MG/ML SOLN  COMPARISON: None.  FINDINGS: Lower chest: Lung bases are clear.  Hepatobiliary: Liver is within normal limits.  Gallbladder is unremarkable. No intrahepatic or extrahepatic ductal dilatation.  Pancreas: Within normal limits.  Spleen: Within normal limits.  Adrenals/Urinary Tract: Adrenal glands are unremarkable.  Kidneys are within normal limits. No hydronephrosis.  Bladder is within normal limits  Stomach/Bowel: Greater curvature of the stomach is medially displaced but otherwise within normal limits.  No evidence of bowel obstruction.  Normal appendix.  Vascular/Lymphatic: No evidence of abdominal aortic aneurysm.  No suspicious abdominopelvic lymphadenopathy.  Reproductive: Prostate is unremarkable.  Other: 15.3 x 6.9 x 11.0 cm macrolobulated soft tissue lesion in the left upper abdomen (series 2/ image 22; coronal image 32) which displaces the stomach medially in the spleen posteriorly. This appearance is worrisome for tumor such as lymphoma or possibly sarcoma. Hematoma is considered less likely in the absence of trauma.  Moderate abdominopelvic ascites, measuring higher than simple fluid density. No frank peritoneal nodularity.  No free air.  Musculoskeletal: Visualized osseous structures are within normal limits.  IMPRESSION: 15.3 cm soft tissue lesion in the left upper abdomen, worrisome for tumor such as lymphoma or possibly sarcoma. Hematoma is considered less likely in the absence of trauma.  Associated moderate complex/ high density  abdominopelvic ascites. No frank peritoneal nodularity.  No free air per  Given this patient has a history of significant trauma, consider surgical consultation. If biopsy is desired, this is likely amenable via endoscopic ultrasound or percutaneously.  These results were called by telephone at the time of interpretation on 02/20/2014 at 2:20 pm to Dr. ELIZABETH REES , who verbally acknowledged these results.   Electronically Signed  By: Sriyesh Krishnan M.D.  On: 02/20/2014 14:26   Study Result     CLINICAL DATA: Acute renal failure.  EXAM: RENAL/URINARY TRACT ULTRASOUND COMPLETE  COMPARISON: None.  FINDINGS: Right Kidney:  Length: 11.5 cm. Increased parenchymal echogenicity. No renal mass or stone. No hydronephrosis.  Left   Kidney:  Length: 12.0 cm. Increased parenchymal echogenicity. No renal mass or stone. No hydronephrosis.  Bladder:  Appears normal for degree of bladder distention.  Small amount of ascites is seen along the inferior margin of the liver.  IMPRESSION: 1. No hydronephrosis. 2. Increased renal parenchymal echogenicity bilaterally consistent with medical renal disease. 3. Small amount of ascites. 4. No other abnormalities.   Electronically Signed  By: David Ormond M.D.     IMPRESSION/PLAN: 18-year-old male admitted with abdominal pain, found to have an abdominal mass amongst a 6 month history of nausea and dry heaving. Patient has had biopsy of this mass with pathology showing adipose tissue and blood clots. He is currently anemic with hemoglobin in the mid 7s, With a normal MCV. Iron was found to be low on 221. He has heme-positive stools. His nausea and heme-positive stools may possibly be due to gastritis or an ulcer. His LFTs have been normal. Plan is to schedule him for an EGD later today to evaluate for possible gastritis, ulcer, possibly a small Mallory-Weiss tear that may account for his heme-positive stools.  Will also check an IgA and TTG.    Hvozdovic, Lori P PA-C 02/26/2014,  Pager [phone]      Attending physician's note   I have taken an interval history, reviewed the chart and examined the patient. I agree with the Advanced Practitioner's note, impression and recommendations. Pt with apparent trauma induced abdominal hematoma, abdominal pain, iron deficiency anemia and heme + stool. R/O ulcer, gastritis, MW tear, celiac disease, etc. causing iron deficiency/heme + stool. The majority of his anemia, abdominal pain are very likely related to the large abdominal hematoma. EGD today to further evaluated. The risks (including bleeding, perforation, infection, missed lesions, medication reactions and possible hospitalization or surgery if complications occur), benefits, and alternatives to endoscopy with possible biopsy and possible dilation were discussed with the patient and they consent to proceed.    Nova Schmuhl T. Jalon Squier, MD FACG 

## 2014-02-26 NOTE — Progress Notes (Addendum)
  [facility] Progress Note   Subjective: Feeling good, not recording urine but making good amounts per pt  Filed Vitals:   02/25/14 0502 02/25/14 1352 02/25/14 2116 02/26/14 0524  BP: 138/81 143/70 158/90 139/87  Pulse: 61 79 69 31  Temp: 98.7 F (37.1 C) 99 F (37.2 C) 99.2 F (37.3 C) 99.2 F (37.3 C)  TempSrc: Oral Oral Oral Oral  Resp: 18 20 19 18   Height:      Weight: 79.5 kg (175 lb 4.3 oz)   80.9 kg (178 lb 5.6 oz)  SpO2: 97% 96% 100% 100%   Exam: Alert, no distress No jvd Chest clear bilat RRR no MRG Abd soft, NTND, no mass or HSM No LE edema Neuro is alert, nf, Ox 3  Renal US ^'d echo c/w medical renal disease, o/w normal US UA 2/19 negative UA 2/23 - 1.009, 6.0, neg protein, 0-2 WBC/RBC per HPF UNa 86, UCr 76       Assessment: 1. Acute kidney injury / ATN -from IV contrast/ vol depletion / NSAID's; early recovery phase, creat down 2.5 2. Abdominal mass - s/p biopsy showed blood clot and adipose tissue, unclear origin. Dr Waymon AmatoHongalgi got a trauma history today that about a month ago he fall off his trailer and landed on the hitch which impacted his abdomen. Radiology suggested US of mass could help differentiate clot from tumor if we are still considering tumor.  3. Volume - is euvolemic, BP good, walking in halls 4. SIRS - cx's negative, WBC now normal, on Zosyn 5. Anemia - Hb drop to 7's, +stool for blood. Hb 8's today  Plan - stop IVF"s and would keep in hospital for now. US ordered.     Vinson Moselleob Mylene Bow MD  pager [phone]    cell [phone]  02/26/2014, 9:03 AM     Recent Labs Lab 02/24/14 0449 02/25/14 0522 02/26/14 0440  NA 135 139 135  K 3.6 4.1 3.3*  CL 105 108 109  CO2 24 23 22   GLUCOSE 94 105* 121*  BUN 10 13 14   CREATININE 2.71* 2.75* 2.59*  CALCIUM 8.3* 8.5 8.1*    Recent Labs Lab 02/20/14 1049 02/23/14 0500  AST 27 16  ALT 27 17  ALKPHOS 97 62  BILITOT 0.4 0.9  PROT 7.1 5.6*  ALBUMIN 4.4 3.1*    Recent  Labs Lab 02/20/14 1049  02/23/14 0500 02/24/14 0449 02/25/14 0522 02/26/14 0440  WBC 16.5*  < > 8.5 8.4 9.0 9.7  NEUTROABS 13.3*  --  5.5  --   --   --   HGB 14.1  < > 7.4* 7.3* 7.7* 8.1*  HCT 41.4  < > 22.2* 21.6* 22.7* 23.5*  MCV 91.2  < > 90.2 90.8 90.4 90.7  PLT 282  < > 163 176 189 202  < > = values in this interval not displayed. . ferrous sulfate  325 mg Oral Q breakfast   . sodium chloride 100 mL/hr at 02/25/14 2200   acetaminophen **OR** acetaminophen, HYDROmorphone (DILAUDID) injection, ondansetron **OR** ondansetron (ZOFRAN) IV

## 2014-02-26 NOTE — Consult Note (Signed)
On February 22 he had a CT-guided biopsy of the abdominal mass in the left upper quadrant   Referring Provider: [facility] Primary Care Physician:  Cresenciano Lick, MD Primary Gastroenterologist:  unassigned  Reason for Consultation:   Anemia, heme positive stool, nausea  HPI: Omar Sheppard is a 19 y.o. male after experiencing acute onset of upper abdominal pain in the epigastric area that started on Thursday. Patient reports that for the past 5-6 months he has felt nauseous every morning and dry heaves and occasionally vomited every morning. He feels fine for the rest of the day and because of this he felt he was having "sympathy nausea" as his fiance is pregnant with her first child and doing May. Approximately 3 weeks ago while at work, he slipped on a tractor trailer bed and hit his right lower left chest and abdominal area. He had immediate excruciating pain and felt like it knocked the wind out of him however the pain quickly subsided and he resumed his normal activities. He had had no increase in his morning nausea since this incident. On Thursday the 18th he dry heaved forcefully and felt a "pop" in the epigastric area and began to experience left upper quadrant pain which he felt was an 8 out of 10 in intensity. He had no associated change in bowel habits or stool caliber. He had no bright red blood per rectum or melena. He had no syncope or loss of consciousness. In the emergency room he was found to have an elevated white blood cell count of 16.5 And hemoglobin of 14.1. His creatinine was 0.88 and his BUN was 14. He had an abdominal CT that showed a 15.3 cm soft tissue lesion in the left upper abdomen, worrisome for tumor such as lymphoma or possibly sarcoma. Hematoma was considered less likely he also had associated moderate complex/high density abdominopelvic ascites no frank peritoneal nodularity no free air. He was evaluated by surgery and interventional radiology was  contacted for a biopsy. Pathology revealed mature adipose tissue and a blood clot. Patient hemoglobin has drifted to 7.3 and today is 7.7. He was found to have heme-positive stools 3 days ago. Due to his ongoing nausea and heme positive stools we have been asked to see him. Patient denies a family history of colon cancer, polyps, inflammatory bowel disease, gastric cancer or esophageal cancer. He has not had any odynophagia or dysphagia. His nausea is fairly constant but it is worse in the morning. It is not exacerbated nor alleviated with ingestion of food. His appetite has been good and his weight has been stable he denies early satiety. He denies use of NSAIDs, aspirin, Gioody powders, etc. He denies use of tobacco, alcohol, or intravenous drugs. Patient says his stools have been loose since he has been in the hospital. His creatinine went up to 2.37 on 221 and 2.7 on 2/23.  he was evaluated by urology who felt his acute kidney injury was likely due to IV contrast and Toradol. Patient has been making good amounts of urine.     Past Medical History  Diagnosis Date  . ADHD (attention deficit hyperactivity disorder)     Past Surgical History  Procedure Laterality Date  . Knee surgery    . Abdominal surgery      when infant    Prior to Admission medications   Not on File    Current Facility-Administered Medications  Medication Dose Route Frequency Provider Last Rate Last Dose  . 0.9 %  sodium chloride infusion   Intravenous Continuous Maree Krabbe, MD 100 mL/hr at 02/25/14 2200    . acetaminophen (TYLENOL) tablet 650 mg  650 mg Oral Q6H PRN Alison Murray, MD   650 mg at 02/23/14 1224   Or  . acetaminophen (TYLENOL) suppository 650 mg  650 mg Rectal Q6H PRN Alison Murray, MD      . ferrous sulfate tablet 325 mg  325 mg Oral Q breakfast Jerald Kief, MD   325 mg at 02/25/14 0743  . HYDROmorphone (DILAUDID) injection 0.5 mg  0.5 mg Intravenous Q4H PRN Jerald Kief, MD   0.5 mg at  02/26/14 0845  . ondansetron (ZOFRAN) tablet 4 mg  4 mg Oral Q4H PRN Jerald Kief, MD       Or  . ondansetron [facility]) injection 4 mg  4 mg Intravenous Q4H PRN Jerald Kief, MD   4 mg at 02/26/14 0845    Allergies as of 02/20/2014  . (No Known Allergies)    History reviewed. No pertinent family history.  History   Social History  . Marital Status: Single    Spouse Name: N/A  . Number of Children: N/A  . Years of Education: N/A   Occupational History  . Not on file.   Social History Main Topics  . Smoking status: Current Some Day Smoker    Types: Cigars  . Smokeless tobacco: Not on file  . Alcohol Use: Yes     Comment: occasionally  . Drug Use: Yes    Special: Marijuana     Comment: once a month  . Sexual Activity: Not on file   Other Topics Concern  . Not on file   Social History Narrative    Review of Systems: Gen: Denies any fever, chills, sweats, anorexia, fatigue, weakness, malaise, weight loss, and sleep disorder CV: Denies chest pain, angina, palpitations, syncope, orthopnea, PND, peripheral edema, and claudication. Resp: Denies dyspnea at rest, dyspnea with exercise, cough, sputum, wheezing, coughing up blood, and pleurisy. GI: Denies vomiting blood, jaundice, and fecal incontinence.   Denies dysphagia or odynophagia. GU : Denies urinary burning, blood in urine, urinary frequency, urinary hesitancy, nocturnal urination, and urinary incontinence. MS: Denies joint pain, limitation of movement, and swelling, stiffness, low back pain, extremity pain. Denies muscle weakness, cramps, atrophy.  Derm: Denies rash, itching, dry skin, hives, moles, warts, or unhealing ulcers.  Psych: Denies depression, anxiety, memory loss, suicidal ideation, hallucinations, paranoia, and confusion. Heme: Denies bruising, bleeding, and enlarged lymph nodes. Neuro:  Denies any headaches, dizziness, paresthesias. Endo:  Denies any problems with DM, thyroid, adrenal  function.  Physical Exam: Vital signs in last 24 hours: Temp:  [99 F (37.2 C)-99.2 F (37.3 C)] 99.2 F (37.3 C) (02/25 0524) Pulse Rate:  [31-79] 31 (02/25 0524) Resp:  [18-20] 18 (02/25 0524) BP: (139-158)/(70-90) 139/87 mmHg (02/25 0524) SpO2:  [96 %-100 %] 100 % (02/25 0524) Weight:  [178 lb 5.6 oz (80.9 kg)] 178 lb 5.6 oz (80.9 kg) (02/25 0524) Last BM Date: 02/25/14 General:   Alert,  Well-developed, well-nourished, pleasant and cooperative in NAD Head:  Normocephalic and atraumatic. Eyes:  Sclera clear, no icterus.   Conjunctiva pink. Ears:  Normal auditory acuity. Nose:  No deformity, discharge,  or lesions. Mouth:  No deformity or lesions.   Neck:  Supple; no masses or thyromegaly. Lungs:  Clear throughout to auscultation.   No wheezes, crackles, or rhonchi.  Heart:  Regular rate and rhythm; no  murmurs, clicks, rubs,  or gallops. Abdomen:  Soft,tender diffusely throughout with no rebound or guarding, BS active,nonpalp mass or hsm.   Rectal:  Scant brown stool, heme negative. Perianal area erythematous Msk:  Symmetrical without gross deformities. . Pulses:  Normal pulses noted. Extremities:  Without clubbing or edema. Neurologic:  Alert and  oriented x4;  grossly normal neurologically. Skin:  Intact without significant lesions or rashes.. Psych:  Alert and cooperative. Normal mood and affect.  Intake/Output from previous day: 02/24 0701 - 02/25 0700 In: 4730 [P.O.:1680; I.V.:3050] Out: 900 [Urine:900] Intake/Output this shift:    Lab Results:  Recent Labs  02/24/14 0449 02/25/14 0522 02/26/14 0440  WBC 8.4 9.0 9.7  HGB 7.3* 7.7* 8.1*  HCT 21.6* 22.7* 23.5*  PLT 176 189 202   on February 21 iron 18, TIBC 242, saturation ratio 7, vitamin B-12 564. Folate 1350.  BMET  Recent Labs  02/24/14 0449 02/25/14 0522 02/26/14 0440  NA 135 139 135  K 3.6 4.1 3.3*  CL 105 108 109  CO2 24 23 22   GLUCOSE 94 105* 121*  BUN 10 13 14   CREATININE 2.71* 2.75* 2.59*   CALCIUM 8.3* 8.5 8.1*      Studies/Results: Study Result     CLINICAL DATA: Abdominal pain, nausea/ vomiting  EXAM: CT ABDOMEN AND PELVIS WITH CONTRAST  TECHNIQUE: Multidetector CT imaging of the abdomen and pelvis was performed using the standard protocol following bolus administration of intravenous contrast.  CONTRAST: 100mL OMNIPAQUE IOHEXOL 300 MG/ML SOLN  COMPARISON: None.  FINDINGS: Lower chest: Lung bases are clear.  Hepatobiliary: Liver is within normal limits.  Gallbladder is unremarkable. No intrahepatic or extrahepatic ductal dilatation.  Pancreas: Within normal limits.  Spleen: Within normal limits.  Adrenals/Urinary Tract: Adrenal glands are unremarkable.  Kidneys are within normal limits. No hydronephrosis.  Bladder is within normal limits  Stomach/Bowel: Greater curvature of the stomach is medially displaced but otherwise within normal limits.  No evidence of bowel obstruction.  Normal appendix.  Vascular/Lymphatic: No evidence of abdominal aortic aneurysm.  No suspicious abdominopelvic lymphadenopathy.  Reproductive: Prostate is unremarkable.  Other: 15.3 x 6.9 x 11.0 cm macrolobulated soft tissue lesion in the left upper abdomen (series 2/ image 22; coronal image 32) which displaces the stomach medially in the spleen posteriorly. This appearance is worrisome for tumor such as lymphoma or possibly sarcoma. Hematoma is considered less likely in the absence of trauma.  Moderate abdominopelvic ascites, measuring higher than simple fluid density. No frank peritoneal nodularity.  No free air.  Musculoskeletal: Visualized osseous structures are within normal limits.  IMPRESSION: 15.3 cm soft tissue lesion in the left upper abdomen, worrisome for tumor such as lymphoma or possibly sarcoma. Hematoma is considered less likely in the absence of trauma.  Associated moderate complex/ high density  abdominopelvic ascites. No frank peritoneal nodularity.  No free air per  Given this patient has a history of significant trauma, consider surgical consultation. If biopsy is desired, this is likely amenable via endoscopic ultrasound or percutaneously.  These results were called by telephone at the time of interpretation on 02/20/2014 at 2:20 pm to Dr. Tilden FossaELIZABETH REES , who verbally acknowledged these results.   Electronically Signed  By: Charline BillsSriyesh Krishnan M.D.  On: 02/20/2014 14:26   Study Result     CLINICAL DATA: Acute renal failure.  EXAM: RENAL/URINARY TRACT ULTRASOUND COMPLETE  COMPARISON: None.  FINDINGS: Right Kidney:  Length: 11.5 cm. Increased parenchymal echogenicity. No renal mass or stone. No hydronephrosis.  Left  Kidney:  Length: 12.0 cm. Increased parenchymal echogenicity. No renal mass or stone. No hydronephrosis.  Bladder:  Appears normal for degree of bladder distention.  Small amount of ascites is seen along the inferior margin of the liver.  IMPRESSION: 1. No hydronephrosis. 2. Increased renal parenchymal echogenicity bilaterally consistent with medical renal disease. 3. Small amount of ascites. 4. No other abnormalities.   Electronically Signed  By: Amie Portland M.D.     IMPRESSION/PLAN: 19 year old male admitted with abdominal pain, found to have an abdominal mass amongst a 6 month history of nausea and dry heaving. Patient has had biopsy of this mass with pathology showing adipose tissue and blood clots. He is currently anemic with hemoglobin in the mid 7s, With a normal MCV. Iron was found to be low on 221. He has heme-positive stools. His nausea and heme-positive stools may possibly be due to gastritis or an ulcer. His LFTs have been normal. Plan is to schedule him for an EGD later today to evaluate for possible gastritis, ulcer, possibly a small Mallory-Weiss tear that may account for his heme-positive stools.  Will also check an IgA and TTG.    Hvozdovic, Moise Boring 02/26/2014,  Pager [phone]      Attending physician's note   I have taken an interval history, reviewed the chart and examined the patient. I agree with the Advanced Practitioner's note, impression and recommendations. Pt with apparent trauma induced abdominal hematoma, abdominal pain, iron deficiency anemia and heme + stool. R/O ulcer, gastritis, MW tear, celiac disease, etc. causing iron deficiency/heme + stool. The majority of his anemia, abdominal pain are very likely related to the large abdominal hematoma. EGD today to further evaluated. The risks (including bleeding, perforation, infection, missed lesions, medication reactions and possible hospitalization or surgery if complications occur), benefits, and alternatives to endoscopy with possible biopsy and possible dilation were discussed with the patient and they consent to proceed.    Venita Lick. Russella Dar, MD [facility]

## 2014-02-26 NOTE — Progress Notes (Signed)
[facility] PROGRESS NOTE  Omar Sheppard ZOX:[id number] DOB: 08/14/1995 DOA: 02/20/2014 PCP: Cresenciano Lick, MD  Assessment/Plan: 1. Abdominal mass with abd pain 1. 15.3cm L upper abd mass noted on imaging. Patient gives history of blunt trauma to abdomen, and prior to admission and while length nausea/retching with "something pop in his chest" followed by acute abdominal pain on day of admission 2. General Surgery following 3. Pt underwent biopsy on 2/22 with results of blood clot as of 2/23. Pathology demonstrates mature adipose tissue and blood clot. 4. Hgb holding stable in the 7.5 range 5. No signs of further bleed per General Surgery with recs for repeat CT in 6 weeks to r/o underlying lesion 6. Dr. Arlean Hopping discussed with radiology on 2/25 who suggested abdominal ultrasound of mass to see if we could help differentiate hematoma/clot from tumor. Ultrasound abdomen shows soft tissue mass without internal flow. Differential remains as hematoma versus mass. 2. SIRS 1. Unclear etiology 2. WBC now normalized to 7.8k 3. UA is neg for UTI 4. Blood cultures thus far w/o growth, still pending final results 5. Was on Zosyn and vancomycin. DC'd vancomycin before. DC Zosyn 2/24 and monitor off antibiotics. 3. Acute renal failure 1. New finding as of 2/23 2. Renal US with no hydronephrosis 3. As per nephrology: Etiology ATN-from IV contrast/volume depletion/NSAIDs. Creatinine has stabilized. Nonoliguric. IV fluids being decreased. Follow BMP. Improving 4. Syncope/bradycardia 1. No further occurrence. DC telemetry.  5. DVT prophylaxis 1. SCD's 6. Anemia, iron deficiency 1. Hgb drop from around 11.1 to 7.6 from 2/19 to 2/21. Stable and improving. 2. Stools are occult positive 3. Iron low at 18. Check ferritin. 4. Have added oral iron 5. Given anemia, and FOBT +. GI consulted on 2/25 who performed EGD which showed esophagitis and gastritis. 6. Suspect anemia is secondary to  blood loss into abd cavity per above. Patient denies hematemesis, melena or rectal bleeding.  Esophagitis and erosive gastritis: Status post EGD 2/25. Continue PPI. Follow pathology results. Avoid NSAIDs or aspirin. This could explain heme positive stools. Not sure if it explains iron deficiency anemia.  Hypokalemia: Replace and follow  Code Status: Full Family Communication: Discussed with fianc at bedside. Disposition Plan: Pending improvement in renal functions   Consultants:  General Surgery  IR  Dr. Narda Rutherford discussed case with GI over phone  Nephrology  [facility]  Procedures:  EGD 2/25  Antibiotics:  Zosyn 2/19>>> 2/24  Vancomycin 2/19>>>2/22  HPI/Subjective: Continues to complain of moderate midabdominal pain, intermittent nausea and vomiting.  Objective: Filed Vitals:   02/26/14 1350 02/26/14 1355 02/26/14 1400 02/26/14 1428  BP: 169/82   155/85  Pulse: 54 56 62 50  Temp:    98 F (36.7 C)  TempSrc:    Oral  Resp: Height:      Weight:      SpO2: 90% 91% 93% 94%    Intake/Output Summary (Last 24 hours) at 02/26/14 1741 Last data filed at 02/26/14 1300  Gross per 24 hour  Intake   4010 ml  Output    600 ml  Net   3410 ml   Filed Weights   02/24/14 0541 02/25/14 0502 02/26/14 0524  Weight: 77 kg (169 lb 12.1 oz) 79.5 kg (175 lb 4.3 oz) 80.9 kg (178 lb 5.6 oz)    Exam:   General:  Awake, laying in bed, in nad  Cardiovascular: regular, s1, s2  Respiratory: normal resp effort, no wheezing  Abdomen: soft,  nondistended and mild periumbilical tenderness without rigidity, guarding or rebound.  Musculoskeletal: perfused, no clubbing   Data Reviewed: Basic Metabolic Panel:  Recent Labs Lab 02/22/14 0500 02/23/14 0500 02/23/14 0920 02/24/14 0449 02/25/14 0522 02/26/14 0440  NA 138 138 140 135 139 135  K 3.5 3.7 3.6 3.6 4.1 3.3*  CL 104 106 109 105 108 109  CO2 GLUCOSE 117* 90 97 94 105* 121*   BUN CREATININE 0.87 2.37* 2.39* 2.71* 2.75* 2.59*  CALCIUM 8.4 8.5 8.5 8.3* 8.5 8.1*  MG 1.7  --   --   --   --   --    Liver Function Tests:  Recent Labs Lab 02/20/14 1049 02/23/14 0500  AST 27 16  ALT 27 17  ALKPHOS 97 62  BILITOT 0.4 0.9  PROT 7.1 5.6*  ALBUMIN 4.4 3.1*    Recent Labs Lab 02/20/14 1049  LIPASE 28   No results for input(s): AMMONIA in the last 168 hours. CBC:  Recent Labs Lab 02/20/14 1049  02/22/14 0500  02/22/14 1708 02/23/14 0500 02/24/14 0449 02/25/14 0522 02/26/14 0440  WBC 16.5*  < > 7.8  --   --  8.5 8.4 9.0 9.7  NEUTROABS 13.3*  --   --   --   --  5.5  --   --   --   HGB 14.1  < > 7.6*  < > 7.9* 7.4* 7.3* 7.7* 8.1*  HCT 41.4  < > 22.6*  < > 23.5*  23.4* 22.2* 21.6* 22.7* 23.5*  MCV 91.2  < > 91.5  --   --  90.2 90.8 90.4 90.7  PLT 282  < > 172  --   --  163 176 189 202  < > = values in this interval not displayed. Cardiac Enzymes:  Recent Labs Lab 02/20/14 2340 02/21/14 0538  TROPONINI <0.03 <0.03   BNP (last 3 results)  Recent Labs  02/20/14 1743  BNP 31.5    ProBNP (last 3 results) No results for input(s): PROBNP in the last 8760 hours.  CBG:  Recent Labs Lab 02/22/14 0851 02/23/14 0944 02/24/14 0732 02/25/14 0744 02/26/14 0736  GLUCAP 90 90 102* 92 107*    Recent Results (from the past 240 hour(s))  Culture, Urine     Status: None   Collection Time: 02/20/14  5:48 PM  Result Value Ref Range Status   Specimen Description URINE, CLEAN CATCH  Final   Special Requests NONE  Final   Colony Count   Final    10,000 COLONIES/ML Performed at [facility]    Culture   Final    DIPHTHEROIDS(CORYNEBACTERIUM SPECIES) Note: Standardized susceptibility testing for this organism is not available. Performed at [facility]    Report Status 02/22/2014 FINAL  Final  Culture, blood (routine x 2)     Status: None   Collection Time: 02/20/14  6:06 PM  Result Value Ref Range Status    Specimen Description BLOOD RIGHT HAND  5 ML IN [facility] BOTTLE  Final   Special Requests NONE  Final   Culture   Final    NO GROWTH 5 DAYS Performed at [facility]    Report Status 02/26/2014 FINAL  Final  Culture, blood (routine x 2)     Status: None   Collection Time: 02/20/14  6:17 PM  Result Value Ref Range Status   Specimen Description BLOOD LEFT HAND  3 [facility]  BOTTLE  Final   Special Requests NONE  Final   Culture   Final    NO GROWTH 5 DAYS [facility]formed at [facility]    Report Status 02/26/2014 FINAL  Final  MRSA PCR Screening     Status: None   Collection Time: 02/20/14  7:57 PM  Result Value Ref Range Status   MRSA by PCR NEGATIVE NEGATIVE Final    Comment:        The GeneXpert MRSA Assay (FDA approved for NASAL specimens only), is one component of a comprehensive MRSA colonization surveillance program. It is not intended to diagnose MRSA infection nor to guide or monitor treatment for MRSA infections.      Studies: [place] Abdomen Limited  02/26/2014   CLINICAL DATA:  19 year old male with a history of left upper quadrant mass.  The patient has undergone percutaneous biopsy, with a result of blood clot and adipose tissue. Ultrasound survey has been completed to evaluate for internal flow.  EXAM: US ABDOMEN LIMITED - RIGHT UPPER QUADRANT  COMPARISON:  CT study 02/20/2014, 02/23/2014  FINDINGS: Targeted ultrasound survey of left upper quadrant mass with grayscale and duplex imaging.  Heterogeneously hypoechoic ill-defined mass in the left upper quadrant, subjacent to the liver margin in a similar region on prior CT identified. Greatest measurement on today's study 9.8 cm x 6.3 cm. No internal flow identified on duplex imaging. No internal calcifications.  IMPRESSION: Sonographic survey of left upper quadrant identifies ill-defined soft tissue mass which was present on prior CT studies. No significant internal flow. Differential includes hematoma or soft tissue  mass.  Signed,  Yvone NeuJaime S. Loreta AveWagner, DO  [facility]  [facility]   Electronically Signed   By: Gilmer MorJaime  Wagner D.O.   On: 02/26/2014 12:03    Scheduled Meds: . ferrous sulfate  325 mg Oral BID WC  . pantoprazole  40 mg Oral Daily   Continuous Infusions: . sodium chloride 500 mL (02/26/14 1237)    Principal Problem:   Abdominal mass Active Problems:   Syncope   Bradycardia   Sepsis   Occult blood in stools   Iron deficiency anemia   Abdominal pain   Abdominal mass, LUQ (left upper quadrant)   Time spent: 20 minutes  Kahdijah Errickson, MD, FACP, FHM. [facility] Pager [phone]  If 7PM-7AM, please contact night-coverage [email or web address] Password [id number] 02/26/2014, 5:41 PM    LOS: 6 days

## 2014-02-27 ENCOUNTER — Inpatient Hospital Stay: Payer: Self-pay

## 2014-02-27 ENCOUNTER — Encounter (HOSPITAL_COMMUNITY): Payer: Self-pay | Admitting: Gastroenterology

## 2014-02-27 LAB — BASIC METABOLIC PANEL
ANION GAP: 7 (ref 5–15)
BUN: 16 mg/dL (ref 6–23)
CO2: 23 mmol/L (ref 19–32)
Calcium: 8.6 mg/dL (ref 8.4–10.5)
Chloride: 105 mmol/L (ref 96–112)
Creatinine, Ser: 2.34 mg/dL — ABNORMAL HIGH (ref 0.50–1.35)
GFR calc non Af Amer: 38 mL/min — ABNORMAL LOW (ref >90)
GFR, EST AFRICAN AMERICAN: 44 mL/min — AB (ref >90)
Glucose, Bld: 97 mg/dL (ref 70–99)
POTASSIUM: 3.7 mmol/L (ref 3.5–5.1)
Sodium: 135 mmol/L (ref 135–145)

## 2014-02-27 LAB — CBC
HEMATOCRIT: 24.1 % — AB (ref 39.0–52.0)
Hemoglobin: 8.3 g/dL — ABNORMAL LOW (ref 13.0–17.0)
MCH: 30.7 pg (ref 26.0–34.0)
MCHC: 34.4 g/dL (ref 30.0–36.0)
MCV: 89.3 fL (ref 78.0–100.0)
Platelets: 216 10*3/uL (ref 150–400)
RBC: 2.7 MIL/uL — ABNORMAL LOW (ref 4.22–5.81)
RDW: 14.1 % (ref 11.5–15.5)
WBC: 10.5 10*3/uL (ref 4.0–10.5)

## 2014-02-27 LAB — FERRITIN: FERRITIN: 285 ng/mL (ref 22–322)

## 2014-02-27 MED ORDER — HYDROCODONE-ACETAMINOPHEN 5-325 MG PO TABS
1.0000 | ORAL_TABLET | Freq: Four times a day (QID) | ORAL | Status: DC | PRN
  Administered 2014-02-27 (×2): 2 via ORAL
  Filled 2014-02-27 (×2): qty 2

## 2014-02-27 NOTE — Progress Notes (Signed)
  [facility] Progress Note   Subjective: Feeling good, not recording urine but making good amounts per pt  Filed Vitals:   02/26/14 1428 02/26/14 2125 02/26/14 2210 02/27/14 0655  BP: 155/85 157/83 150/85 125/76  Pulse: 50 71  68  Temp: 98 F (36.7 C) 97.7 F (36.5 C)  98 F (36.7 C)  TempSrc: Oral Oral  Oral  Resp: 18 18  18   Height:      Weight:      SpO2: 94% 100%  97%   Exam: Alert, no distress No jvd Chest clear bilat RRR no MRG Abd soft, NTND, no mass or HSM No LE edema Neuro is alert, nf, Ox 3  Renal US ^'d echo c/w medical renal disease, o/w normal US UA 2/19 negative UA 2/23 - 1.009, 6.0, neg protein, 0-2 WBC/RBC per HPF UNa 86, UCr 76       Assessment: 1. Acute kidney injury / ATN - due to IV contrast/ vol depletion/ NSAID's; still having some vomiting and creat not coming down rapidly . Would keep in hospital, would like to see vomiting improve on PPI hopefully and renal function improving more. Have d/w pt and primary.  2. Abdominal mass - prob abd bleed from trauma; surg plans to have pt f/u for repeat scan in about 6wks 3. Anemia - big Hb drop from hemodilution in part, stable now 4. SIRS - cx's negative, WBC now normal, off abx  Plan - encourage cont fluids po and salt, labs in am    Vinson Moselleob Kaisy Severino MD  pager [phone]    cell [phone]  02/27/2014, 9:58 AM     Recent Labs Lab 02/25/14 0522 02/26/14 0440 02/27/14 0550  NA 139 135 135  K 4.1 3.3* 3.7  CL 108 109 105  CO2 23 22 23   GLUCOSE 105* 121* 97  BUN 13 14 16   CREATININE 2.75* 2.59* 2.34*  CALCIUM 8.5 8.1* 8.6    Recent Labs Lab 02/20/14 1049 02/23/14 0500  AST 27 16  ALT 27 17  ALKPHOS 97 62  BILITOT 0.4 0.9  PROT 7.1 5.6*  ALBUMIN 4.4 3.1*    Recent Labs Lab 02/20/14 1049  02/23/14 0500  02/25/14 0522 02/26/14 0440 02/27/14 0550  WBC 16.5*  < > 8.5  < > 9.0 9.7 10.5  NEUTROABS 13.3*  --  5.5  --   --   --   --   HGB 14.1  < > 7.4*  < > 7.7* 8.1*  8.3*  HCT 41.4  < > 22.2*  < > 22.7* 23.5* 24.1*  MCV 91.2  < > 90.2  < > 90.4 90.7 89.3  PLT 282  < > 163  < > 189 202 216  < > = values in this interval not displayed. . ferrous sulfate  325 mg Oral BID WC  . pantoprazole  40 mg Oral Daily   . sodium chloride 500 mL (02/26/14 1237)   acetaminophen **OR** acetaminophen, HYDROmorphone (DILAUDID) injection, ondansetron **OR** ondansetron (ZOFRAN) IV, promethazine

## 2014-02-27 NOTE — Progress Notes (Signed)
Pt c/o headache that increased from 5 to 10 on 0-10 pain scale over 3 hours. B/P @1143  167/90 left arm and 164/102 right arm. Gave 0.5mg  Dilaudid and 4mg  Zofran @ 1147 per orders. Patient's B/P now 148/91 and states pain score of 5. Dr. Waymon AmatoHongalgi aware. Delford FieldGagliano, Kaycen Whitworth E, RN

## 2014-02-27 NOTE — Progress Notes (Signed)
Pt can walk in on Weds 03/04/14,  at 0900 [facility]. Pt made aware of the following.

## 2014-02-27 NOTE — Progress Notes (Signed)
[facility] PROGRESS NOTE  Omar Sheppard UJW:[id number] DOB: [birth year] DOA: 02/20/2014 PCP: Cresenciano Lick, MD  Assessment/Plan: 1. Abdominal mass with abd pain 1. 15.3cm L upper abd mass noted on imaging. Patient gives history of blunt trauma to abdomen, and prior to admission and while length nausea/retching with "something pop in his chest" followed by acute abdominal pain on day of admission 2. General Surgery following 3. Pt underwent biopsy on 2/22 with results of blood clot as of 2/23. Pathology demonstrates mature adipose tissue and blood clot. 4. Hgb holding stable in the 7.5 range 5. No signs of further bleed per General Surgery with recs for repeat CT in 6 weeks to r/o underlying lesion. Discussed with surgery on 2/26: Laparoscopy at this time is unlikely to help-we'll only see blood in the abdominal cavity. 6. Dr. Arlean Hopping discussed with radiology on 2/25 who suggested abdominal ultrasound of mass to see if we could help differentiate hematoma/clot from tumor. Ultrasound abdomen shows soft tissue mass without internal flow. Differential remains as hematoma versus mass. 7. Patient and fianc counseled extensively regarding importance of outpatient follow-up with [facility] with repeat CT abdomen since he could still have underlying mass lesion. They verbalized understanding. 2. SIRS 1. Unclear etiology 2. WBC now normalized to 7.8k 3. UA is neg for UTI 4. Blood cultures thus far w/o growth, still pending final results 5. Was on Zosyn and vancomycin. DC'd vancomycin before. DC Zosyn 2/24 and monitor off antibiotics. 3. Acute renal failure 1. New finding as of 2/23 2. Renal US with no hydronephrosis 3. As per nephrology: Etiology ATN-from IV contrast/volume depletion/NSAIDs. Creatinine has stabilized. Nonoliguric. IV fluids being decreased. Follow BMP. Improving 4. Syncope/bradycardia 1. No further occurrence. DC telemetry.  5. DVT prophylaxis 1. SCD's 6. Anemia,  iron deficiency 1. Hgb drop from around 11.1 to 7.6 from 2/19 to 2/21. Stable and improving. 2. Stools are occult positive 3. Iron low at 18. Check ferritin-285. 4. Have added oral iron 5. Given anemia, and FOBT +. GI consulted on 2/25 who performed EGD which showed esophagitis and gastritis. 6. Suspect anemia is secondary to blood loss into abd cavity per above. Patient denies hematemesis, melena or rectal bleeding.  Esophagitis and erosive gastritis: Status post EGD 2/25. Continue PPI. Follow pathology results. Avoid NSAIDs or aspirin. This could explain heme positive stools. Not sure if it explains iron deficiency anemia. Advised patient and fiance that he should consume frequent small meals and no carbonated drinks.  Hypokalemia: Replaced  Headache: Unclear etiology. Patient states that he has infrequent headaches at home. Improved/resolved after pain medications.  Code Status: Full Family Communication: Discussed with fianc at bedside. Disposition Plan: Pending improvement in renal functions and consistently tolerating diet-hopefully in the next 1-2 days.   Consultants:  General Surgery  IR  Dr. Narda Rutherford discussed case with GI over phone  Nephrology  [facility]  Procedures:  EGD 2/25  Antibiotics:  Zosyn 2/19>>> 2/24  Vancomycin 2/19>>>2/22  HPI/Subjective: Has been drinking Sprite liberally. No vomiting. No BM over last 24 hours. Nausea and abdominal pain improved. Had headache early this afternoon-improved. No nasal congestion.  Objective: Filed Vitals:   02/27/14 1225 02/27/14 1242 02/27/14 1434 02/27/14 1436  BP: 148/91 148/98 141/66 154/93  Pulse: 59 58 56   Temp:  98.3 F (36.8 C)    TempSrc:  Oral    Resp:  16    Height:      Weight:      SpO2:  99%  Intake/Output Summary (Last 24 hours) at 02/27/14 1721 Last data filed at 02/27/14 1532  Gross per 24 hour  Intake   1200 ml  Output    500 ml  Net    700 ml   Filed Weights    02/24/14 0541 02/25/14 0502 02/26/14 0524  Weight: 77 kg (169 lb 12.1 oz) 79.5 kg (175 lb 4.3 oz) 80.9 kg (178 lb 5.6 oz)    Exam:   General:  Awake, laying in bed, in nad  Cardiovascular: regular, s1, s2  Respiratory: normal resp effort, no wheezing  Abdomen: soft, nondistended and nontender. Normal bowel sounds heard.  Musculoskeletal: perfused, no clubbing   Data Reviewed: Basic Metabolic Panel:  Recent Labs Lab 02/22/14 0500  02/23/14 0920 02/24/14 0449 02/25/14 0522 02/26/14 0440 02/27/14 0550  NA 138  < > 140 135 139 135 135  K 3.5  < > 3.6 3.6 4.1 3.3* 3.7  CL 104  < > 109 105 108 109 105  CO2 25  < > 25 24 23 22 23   GLUCOSE 117*  < > 97 94 105* 121* 97  BUN 6  < > 9 10 13 14 16   CREATININE 0.87  < > 2.39* 2.71* 2.75* 2.59* 2.34*  CALCIUM 8.4  < > 8.5 8.3* 8.5 8.1* 8.6  MG 1.7  --   --   --   --   --   --   < > = values in this interval not displayed. Liver Function Tests:  Recent Labs Lab 02/23/14 0500  AST 16  ALT 17  ALKPHOS 62  BILITOT 0.9  PROT 5.6*  ALBUMIN 3.1*   No results for input(s): LIPASE, AMYLASE in the last 168 hours. No results for input(s): AMMONIA in the last 168 hours. CBC:  Recent Labs Lab 02/23/14 0500 02/24/14 0449 02/25/14 0522 02/26/14 0440 02/27/14 0550  WBC 8.5 8.4 9.0 9.7 10.5  NEUTROABS 5.5  --   --   --   --   HGB 7.4* 7.3* 7.7* 8.1* 8.3*  HCT 22.2* 21.6* 22.7* 23.5* 24.1*  MCV 90.2 90.8 90.4 90.7 89.3  PLT 163 176 189 202 216   Cardiac Enzymes:  Recent Labs Lab 02/20/14 2340 02/21/14 0538  TROPONINI <0.03 <0.03   BNP (last 3 results)  Recent Labs  02/20/14 1743  BNP 31.5    ProBNP (last 3 results) No results for input(s): PROBNP in the last 8760 hours.  CBG:  Recent Labs Lab 02/22/14 0851 02/23/14 0944 02/24/14 0732 02/25/14 0744 02/26/14 0736  GLUCAP 90 90 102* 92 107*    Recent Results (from the past 240 hour(s))  Culture, Urine     Status: None   Collection Time: 02/20/14  5:48  PM  Result Value Ref Range Status   Specimen Description URINE, CLEAN CATCH  Final   Special Requests NONE  Final   Colony Count   Final    10,000 COLONIES/ML Performed at [facility]    Culture   Final    DIPHTHEROIDS(CORYNEBACTERIUM SPECIES) Note: Standardized susceptibility testing for this organism is not available. Performed at [facility]    Report Status 02/22/2014 FINAL  Final  Culture, blood (routine x 2)     Status: None   Collection Time: 02/20/14  6:06 PM  Result Value Ref Range Status   Specimen Description BLOOD RIGHT HAND  5 [facility] B[facility]  Final   Special Requests NONE  Final   Culture   Final  NO GROWTH 5 DAYS Performed at [facility]    Report Status 02/26/2014 FINAL  Final  Culture, blood (routine x 2)     Status: None   Collection Time: 02/20/14  6:17 PM  Result Value Ref Range Status   Specimen Description BLOOD LEFT HAND  3 ML IN [facility]  BOTTLE  Final   Special Requests NONE  Final   Culture   Final    NO GROWTH 5 DAYS Performed at [facility]    Report Status 02/26/2014 FINAL  Final  MRSA PCR Screening     Status: None   Collection Time: 02/20/14  7:57 PM  Result Value Ref Range Status   MRSA by PCR NEGATIVE NEGATIVE Final    Comment:        The GeneXpert MRSA Assay (FDA approved for NASAL specimens only), is one component of a comprehensive MRSA colonization surveillance program. It is not intended to diagnose MRSA infection nor to guide or monitor treatment for MRSA infections.      Studies: US Abdomen Limited  02/26/2014   CLINICAL DATA:  19 year old male with a history of left upper quadrant mass.  The patient has undergone percutaneous biopsy, with a result of blood clot and adipose tissue. Ultrasound survey has been completed to evaluate for internal flow.  EXAM: US ABDOMEN LIMITED - RIGHT UPPER QUADRANT  COMPARISON:  CT study 02/20/2014, 02/23/2014  FINDINGS: Targeted ultrasound survey of left  upper quadrant mass with grayscale and duplex imaging.  Heterogeneously hypoechoic ill-defined mass in the left upper quadrant, subjacent to the liver margin in a similar region on prior CT identified. Greatest measurement on today's study 9.8 cm x 6.3 cm. No internal flow identified on duplex imaging. No internal calcifications.  IMPRESSION: Sonographic survey of left upper quadrant identifies ill-defined soft tissue mass which was present on prior CT studies. No significant internal flow. Differential includes hematoma or soft tissue mass.  Signed,  Yvone Neu. Loreta Ave, DO  [facility]  [facility]   Electronically Signed   By: Gilmer Mor D.O.   On: 02/26/2014 12:03    Scheduled Meds: . ferrous sulfate  325 mg Oral BID WC  . pantoprazole  40 mg Oral Daily   Continuous Infusions: . sodium chloride 500 mL (02/26/14 1237)    Principal Problem:   Abdominal mass Active Problems:   Syncope   Bradycardia   Sepsis   Occult blood in stools   Iron deficiency anemia   Abdominal pain   Abdominal mass, LUQ (left upper quadrant)   Time spent: 20 minutes  Omar Whack, MD, FACP, FHM. [facility] Pager [phone]  If 7PM-7AM, please contact night-coverage [email or web address] Password [email or web address] 02/27/2014, 5:21 PM    LOS: 7 days

## 2014-02-28 DIAGNOSIS — R1902 Left upper quadrant abdominal swelling, mass and lump: Secondary | ICD-10-CM

## 2014-02-28 DIAGNOSIS — D62 Acute posthemorrhagic anemia: Secondary | ICD-10-CM

## 2014-02-28 LAB — BASIC METABOLIC PANEL
ANION GAP: 8 (ref 5–15)
BUN: 18 mg/dL (ref 6–23)
CO2: 27 mmol/L (ref 19–32)
Calcium: 8.9 mg/dL (ref 8.4–10.5)
Chloride: 103 mmol/L (ref 96–112)
Creatinine, Ser: 2.19 mg/dL — ABNORMAL HIGH (ref 0.50–1.35)
GFR, EST AFRICAN AMERICAN: 48 mL/min — AB (ref >90)
GFR, EST NON AFRICAN AMERICAN: 42 mL/min — AB (ref >90)
GLUCOSE: 95 mg/dL (ref 70–99)
POTASSIUM: 3.3 mmol/L — AB (ref 3.5–5.1)
Sodium: 138 mmol/L (ref 135–145)

## 2014-02-28 LAB — CBC
HCT: 29.3 % — ABNORMAL LOW (ref 39.0–52.0)
Hemoglobin: 9.9 g/dL — ABNORMAL LOW (ref 13.0–17.0)
MCH: 30.5 pg (ref 26.0–34.0)
MCHC: 33.8 g/dL (ref 30.0–36.0)
MCV: 90.2 fL (ref 78.0–100.0)
Platelets: 318 10*3/uL (ref 150–400)
RBC: 3.25 MIL/uL — ABNORMAL LOW (ref 4.22–5.81)
RDW: 14.1 % (ref 11.5–15.5)
WBC: 12.4 10*3/uL — AB (ref 4.0–10.5)

## 2014-02-28 MED ORDER — ONDANSETRON HCL 4 MG PO TABS: 4.0000 mg | ORAL_TABLET | Freq: Three times a day (TID) | ORAL | Status: DC | PRN

## 2014-02-28 MED ORDER — FERROUS SULFATE 325 (65 FE) MG PO TABS: 325.0000 mg | ORAL_TABLET | Freq: Two times a day (BID) | ORAL | Status: DC

## 2014-02-28 MED ORDER — POTASSIUM CHLORIDE CRYS ER 20 MEQ PO TBCR: 40.0000 meq | EXTENDED_RELEASE_TABLET | Freq: Once | ORAL | Status: DC

## 2014-02-28 MED ORDER — PANTOPRAZOLE SODIUM 40 MG PO TBEC: 40.0000 mg | DELAYED_RELEASE_TABLET | Freq: Every day | ORAL | Status: DC

## 2014-02-28 MED ORDER — HYDROCODONE-ACETAMINOPHEN 5-325 MG PO TABS: 1.0000 | ORAL_TABLET | Freq: Four times a day (QID) | ORAL | Status: DC | PRN

## 2014-02-28 NOTE — Discharge Summary (Addendum)
Physician Discharge Summary  Omar Sheppard [facility] XTK:[id number] DOB: 05-25-1995 DOA: 02/20/2014  PCP: [name], MD  Admit date: 02/20/2014 Discharge date: 02/28/2014  Time spent: Greater than 30 minutes  Recommendations for Outpatient Follow-up:  1. [facility] WELLNESS/PCP on 03/04/14 at 9 AM. To be seen with repeat labs (CBC & BMP). 2. Dr. Alphonsa Overall, CCS in 3 weeks to follow-up on abdominal mass/hematoma.   Discharge Diagnoses:  Principal Problem:   Abdominal mass Active Problems:   Syncope   Bradycardia   Sepsis   Occult blood in stools   Iron deficiency anemia   Abdominal pain   Abdominal mass, LUQ (left upper quadrant)   Discharge Condition: Improved & Stable  Diet recommendation: Regular diet. Patient advised regarding frequent small meals and avoiding carbonated drinks.  Filed Weights   02/25/14 0502 02/26/14 0524 02/28/14 0544  Weight: 79.5 kg (175 lb 4.3 oz) 80.9 kg (178 lb 5.6 oz) 76.975 kg (169 lb 11.2 oz)    History of present illness:  19 year old male with no significant past medical history, has a fianc who is pregnant, has been having once every morning nausea and nonbloody emesis for the last couple of months, sustained blunt trauma a month ago when he slipped off the tongue of his truck and are ordered hit his belly, had been having some vague intermittent abdominal discomfort, heard something pop while he was having an episode of nausea and vomiting followed by severe abdominal pain. He felt like he might pass out and presented to the ED. He briefly became hypotensive in the ED. Imaging showed large LUQ mass. Hospitalized for further evaluation and management.  Hospital Course:   1. Large LUQ abdominal mass: Likely acute spontaneous intra-peritoneal hemorrhage. General surgery was consulted. General surgery and oncology recommended biopsy. Interventional radiology perform biopsy and pathology results are as below (showed  blood). There is still concern whether this is just spontaneous hemorrhage/hematoma from blunt trauma to the abdomen or from violent retching versus underlying mass lesion. Multiple physicians including myself have repeatedly and clearly discuss this with patient and his fiance and have advised close outpatient follow-up. They verbalized understanding. Laparoscopic may not be revealing at this time. Ultrasound abdomen was not helpful either. Patient had issues with abdominal pain, nausea and vomiting which have gradually improved. He is tolerating diet. He will follow-up with general surgery with repeat abdominal imaging to further evaluate the mass. 2. Syncope: DD-vasovagal from problem #1 and pain, hypotension versus acute blood loss anemia. Patient asymptomatic at this time without any further episodes. CT head and MRI without acute findings. EEG normal. 3. Sinus bradycardia: Possibly physiologic. Monitored on telemetry without significant events or arrhythmias. 4. SIRS: Patient met SIRS physiology on admission including hypothermia, hypotension, tachycardia and tachypnea. He also had leukocytosis. He had been empirically started on IV vancomycin and Zosyn. Subsequent workup was not concerning for infection and antibiotics were discontinued several days ago without decline. 5. Acute post hemorrhagic anemia: Hemoglobin dropped from 14.1 g on admission to 7.6 g within 48 hours of admission. This was likely secondary to abdominal hematoma/hemorrhage. Hemoglobin since then has stabilized and is gradually increasing. Continue iron supplements and follow CBCs as outpatient. 6. FOBT +: Were not entirely sure if this was related to problem #1. GI was consulted and performed EGD which showed esophagitis and gastritis which may explain FOBT +. No colonoscopy was recommended. 7. Acute renal failure: Couple of days into admission, patient developed acute renal failure on 2/23. Renal  ultrasound with no hydronephrosis.  Nephrology was consulted. Etiology likely ATN-from IV contrast/volume depletion/NSAIDs. Treated supportively with IV fluids. Creatinine is gradually improving. Nephrology has seen today and cleared for discharge with close outpatient follow-up of BMP. Patient and fiance at bedside have been counseled extensively regarding adequate hydration, avoiding NSAIDs. They verbalize understanding. 8. Esophagitis and erosive gastritis: Status post EGD 2/25. Continue PPI. Avoid NSAIDs or aspirin. 9. Hypokalemia: Replaced prior to discharge. 10. Intermittent elevated BP: May be related to pain. Reassess during outpatient follow-up.  Consultations:  General surgery  Interventional radiology  Medical oncology  Nephrology  Gastroenterology  Procedures:  EEG 02/23/14: IMPRESSION: Normal electroencephalogram, awake, and with activation procedures. There are no focal lateralizing or epileptiform features.   Biopsy of abdominal mass by interventional radiology on 02/23/14  EGD 02/26/14: ENDOSCOPIC IMPRESSION: 1. LA Class A esophagitis 2. Erosive gastritis in the gastric body; multiple biopsies performed 3. Small hiatal hernia  RECOMMENDATIONS: 1. Anti-reflux regimen 2. Await pathology results 3. PPI qam 4. Avoid ASA/NSAIDs for now 5. Erosive gastritis/esophagitis could explain heme + stool however there is no clear explanation for Fe def anemia. Await pathology and tTG. No further GI evaluation at this time. Continue Fe replacement and consider IV Fe-per hospitalist   Discharge Exam:  Complaints: Patient states that he feels much better. Tolerated diet without vomiting. Intermittent mild nausea. Following instructions regarding no carbonated drinks, frequent small meals. Having 1-2 soft/formed stools. Abdominal pain is mild intermittent and decreasing.  Filed Vitals:   02/27/14 1434 02/27/14 1436 02/27/14 2042 02/28/14 0544  BP: 141/66 154/93 148/65 170/89  Pulse: 56  65 71   Temp:   98.3 F (36.8 C) 99 F (37.2 C)  TempSrc:   Oral Oral  Resp:   18 18  Height:      Weight:    76.975 kg (169 lb 11.2 oz)  SpO2:   98% 100%    General exam: Pleasant young male lying comfortably in bed. Respiratory system: Clear. No increased work of breathing. Cardiovascular system: S1 & S2 heard, RRR. No JVD, murmurs, gallops, clicks or pedal edema. Gastrointestinal system: Abdomen is nondistended, soft and nontender. Normal bowel sounds heard. Central nervous system: Alert and oriented. No focal neurological deficits. Extremities: Symmetric 5 x 5 power.  Discharge Instructions      Discharge Instructions    Call MD for:  difficulty breathing, headache or visual disturbances    Complete by:  As directed      Call MD for:  extreme fatigue    Complete by:  As directed      Call MD for:  persistant dizziness or light-headedness    Complete by:  As directed      Call MD for:  persistant nausea and vomiting    Complete by:  As directed      Call MD for:  severe uncontrolled pain    Complete by:  As directed      Diet general    Complete by:  As directed      Increase activity slowly    Complete by:  As directed             Medication List    TAKE these medications        ferrous sulfate 325 (65 FE) MG tablet  Take 1 tablet (325 mg total) by mouth 2 (two) times daily with a meal.     HYDROcodone-acetaminophen 5-325 MG per tablet  Commonly known as:  NORCO/VICODIN  Take 1-2  tablets by mouth every 6 (six) hours as needed for moderate pain or severe pain.     ondansetron 4 MG tablet  Commonly known as:  ZOFRAN  Take 1 tablet (4 mg total) by mouth every 8 (eight) hours as needed for nausea or vomiting.     pantoprazole 40 MG tablet  Commonly known as:  PROTONIX  Take 1 tablet (40 mg total) by mouth daily.       Follow-up Information    Follow up with NEWMAN,DAVID H, MD. Schedule an appointment as soon as possible for a visit in 3 weeks.   Specialty:   General Surgery   Why:  For follow up of mass/hematoma.  Call the office to make an appt.   Contact information:   [place] [address] [phone]       Follow up with [facility] On 03/04/2014.   Why:  Please take photo ID, $20 co pay, all medications, discharge paperwork. This is a walk in appointment at 0900 AM Weds 03/04/14. To be seen with repeat labs (CBC & BMP).   Contact information:   [address] [place] [phone]       The results of significant diagnostics from this hospitalization (including imaging, microbiology, ancillary and laboratory) are listed below for reference.    Significant Diagnostic Studies: Ct Head Wo Contrast  02/21/2014   CLINICAL DATA:  Facial numbness.  EXAM: CT HEAD WITHOUT CONTRAST  TECHNIQUE: Contiguous axial images were obtained from the base of the skull through the vertex without intravenous contrast.  COMPARISON:  None.  FINDINGS: The ventricles are normal in size and configuration. No extra-axial fluid collections are identified. The gray-white differentiation is normal. No CT findings for acute intracranial process such as hemorrhage or infarction. No mass lesions. The brainstem and cerebellum are grossly normal.  The bony structures are intact. The paranasal sinuses and mastoid air cells are clear. The globes are intact.  IMPRESSION: Normal head CT.   Electronically Signed   By: Marijo Sanes M.D.   On: 02/21/2014 08:13   Mr Jeri Cos ZM Contrast  02/22/2014   CLINICAL DATA:  Numbness and tingling.  Abdominal mass.  EXAM: MRI HEAD WITH CONTRAST  TECHNIQUE: Multiplanar, multiecho pulse sequences of the brain and surrounding structures were obtained with intravenous contrast.  COMPARISON:  None.  CONTRAST:  15 mL MultiHance IV  FINDINGS: Image quality degraded by motion. Motion became progressively worse during the study.  Ventricle size is normal. Cerebellar  tonsils are low lying measuring 8.5 mm below the foramen magnum without impaction. Pituitary normal in size.  Negative for acute or chronic infarction  Negative for demyelinating disease. Cerebral white matter is normal. Brainstem and cerebellum are normal in signal.  Negative for hemorrhage or fluid collection  Negative for mass or edema. Normal enhancement following contrast infusion. Image quality degraded by motion on the postcontrast images.  IMPRESSION: Cerebral tonsils are low lying without definite Chiari malformation.  Negative for acute abnormality.   Electronically Signed   By: Franchot Gallo M.D.   On: 02/22/2014 15:06   Ct Abdomen Pelvis W Contrast  02/20/2014   CLINICAL DATA:  Abdominal pain, nausea/ vomiting  EXAM: CT ABDOMEN AND PELVIS WITH CONTRAST  TECHNIQUE: Multidetector CT imaging of the abdomen and pelvis was performed using the standard protocol following bolus administration of intravenous contrast.  CONTRAST:  140m OMNIPAQUE IOHEXOL 300 MG/ML  SOLN  COMPARISON:  None.  FINDINGS: Lower chest:  Lung bases are clear.  Hepatobiliary: Liver is within normal limits.  Gallbladder is unremarkable. No intrahepatic or extrahepatic ductal dilatation.  Pancreas: Within normal limits.  Spleen: Within normal limits.  Adrenals/Urinary Tract: Adrenal glands are unremarkable.  Kidneys are within normal limits.  No hydronephrosis.  Bladder is within normal limits  Stomach/Bowel: Greater curvature of the stomach is medially displaced but otherwise within normal limits.  No evidence of bowel obstruction.  Normal appendix.  Vascular/Lymphatic: No evidence of abdominal aortic aneurysm.  No suspicious abdominopelvic lymphadenopathy.  Reproductive: Prostate is unremarkable.  Other: 15.3 x 6.9 x 11.0 cm macrolobulated soft tissue lesion in the left upper abdomen (series 2/ image 22; coronal image 32) which displaces the stomach medially in the spleen posteriorly. This appearance is worrisome for tumor such as  lymphoma or possibly sarcoma. Hematoma is considered less likely in the absence of trauma.  Moderate abdominopelvic ascites, measuring higher than simple fluid density. No frank peritoneal nodularity.  No free air.  Musculoskeletal: Visualized osseous structures are within normal limits.  IMPRESSION: 15.3 cm soft tissue lesion in the left upper abdomen, worrisome for tumor such as lymphoma or possibly sarcoma. Hematoma is considered less likely in the absence of trauma.  Associated moderate complex/ high density abdominopelvic ascites. No frank peritoneal nodularity.  No free air per  Given this patient has a history of significant trauma, consider surgical consultation. If biopsy is desired, this is likely amenable via endoscopic ultrasound or percutaneously.  These results were called by telephone at the time of interpretation on 02/20/2014 at 2:20 pm to Dr. Quintella Reichert , who verbally acknowledged these results.   Electronically Signed   By: Julian Hy M.D.   On: 02/20/2014 14:26   US Renal  02/23/2014   CLINICAL DATA:  Acute renal failure.  EXAM: RENAL/URINARY TRACT ULTRASOUND COMPLETE  COMPARISON:  None.  FINDINGS: Right Kidney:  Length: 11.5 cm. Increased parenchymal echogenicity. No renal mass or stone. No hydronephrosis.  Left Kidney:  Length: 12.0 cm. Increased parenchymal echogenicity. No renal mass or stone. No hydronephrosis.  Bladder:  Appears normal for degree of bladder distention.  Small amount of ascites is seen along the inferior margin of the liver.  IMPRESSION: 1. No hydronephrosis. 2. Increased renal parenchymal echogenicity bilaterally consistent with medical renal disease. 3. Small amount of ascites. 4. No other abnormalities.   Electronically Signed   By: Lajean Manes M.D.   On: 02/23/2014 12:09   US Abdomen Limited  02/26/2014   CLINICAL DATA:  19 year old male with a history of left upper quadrant mass.  The patient has undergone percutaneous biopsy, with a result of blood  clot and adipose tissue. Ultrasound survey has been completed to evaluate for internal flow.  EXAM: US ABDOMEN LIMITED - RIGHT UPPER QUADRANT  COMPARISON:  CT study 02/20/2014, 02/23/2014  FINDINGS: Targeted ultrasound survey of left upper quadrant mass with grayscale and duplex imaging.  Heterogeneously hypoechoic ill-defined mass in the left upper quadrant, subjacent to the liver margin in a similar region on prior CT identified. Greatest measurement on today's study 9.8 cm x 6.3 cm. No internal flow identified on duplex imaging. No internal calcifications.  IMPRESSION: Sonographic survey of left upper quadrant identifies ill-defined soft tissue mass which was present on prior CT studies. No significant internal flow. Differential includes hematoma or soft tissue mass.  Signed,  Dulcy Fanny. Earleen Newport, DO  [facility]  [facility]   Electronically Signed  By: Corrie Mckusick D.O.   On: 02/26/2014 12:03   Ct Biopsy  02/23/2014   CLINICAL DATA:  Left upper quadrant mass  EXAM: CT GUIDED CORE BIOPSY OF LEFT UPPER ABDOMINAL MASS  ANESTHESIA/SEDATION: Intravenous Fentanyl and Versed were administered as conscious sedation during continuous cardiorespiratory monitoring by the radiology RN, with a total moderate sedation time of 10 minutes.  PROCEDURE: The procedure risks, benefits, and alternatives were explained to the patient. Questions regarding the procedure were encouraged and answered. The patient understands and consents to the procedure.  Select axial scans through the upper abdomen were obtained. The mass was localized and an appropriate skin entry site was determined and marked.  The operative field was prepped with Betadinein a sterile fashion, and a sterile drape was applied covering the operative field. A sterile gown and sterile gloves were used for the procedure. Local anesthesia was provided with 1% Lidocaine.  Under CT fluoroscopic guidance, a 17 gauge trocar  needle was advanced to the margin of the lesion. Once needle tip position was confirmed, coaxial 18-gauge core biopsy samples were obtained, submitted in formalin to surgical pathology. The guide needle was removed. Postprocedure scans show no hemorrhage or other apparent complication. The patient tolerated the procedure well.  COMPLICATIONS: None immediate  FINDINGS: Limited CT scanning again demonstrated hyperdense mass external to the greater curvature of the stomach, anterior to the body and tail of the pancreas. Percutaneous core biopsy samples were obtained.  IMPRESSION: 1. Technically successful CT-guided core biopsy of left upper abdominal mass.   Electronically Signed   By: Lucrezia Europe M.D.   On: 02/23/2014 09:48   Dg Chest Port 1 View  02/20/2014   CLINICAL DATA:  2 hr history of chest pain and shortness of breath  EXAM: PORTABLE CHEST - 1 VIEW  COMPARISON:  November 19, 2012  FINDINGS: Lungs are clear. Heart size and pulmonary vascularity are normal. No adenopathy. No pneumothorax. No bone lesions.  IMPRESSION: No edema or consolidation.   Electronically Signed   By: Lowella Grip III M.D.   On: 02/20/2014 11:11    Microbiology: Recent Results (from the past 240 hour(s))  Culture, Urine     Status: None   Collection Time: 02/20/14  5:48 PM  Result Value Ref Range Status   Specimen Description URINE, CLEAN CATCH  Final   Special Requests NONE  Final   Colony Count   Final    10,000 COLONIES/ML Performed at [facility]    Culture   Final    DIPHTHEROIDS(CORYNEBACTERIUM SPECIES) Note: Standardized susceptibility testing for this organism is not available. Performed at [facility]    Report Status 02/22/2014 FINAL  Final  Culture, blood (routine x 2)     Status: None   Collection Time: 02/20/14  6:06 PM  Result Value Ref Range Status   Specimen Description BLOOD RIGHT HAND  5 ML IN [facility] BOTTLE  Final   Special Requests NONE  Final   Culture   Final    NO  GROWTH 5 DAYS Performed at [facility]    Report Status 02/26/2014 FINAL  Final  Culture, blood (routine x 2)     Status: None   Collection Time: 02/20/14  6:17 PM  Result Value Ref Range Status   Specimen Description BLOOD LEFT HAND  3 ML IN [facility]  BOTTLE  Final   Special Requests NONE  Final   Culture   Final    NO GROWTH 5 DAYS Performed  at [facility]    Report Status 02/26/2014 FINAL  Final  MRSA PCR Screening     Status: None   Collection Time: 02/20/14  7:57 PM  Result Value Ref Range Status   MRSA by PCR NEGATIVE NEGATIVE Final    Comment:        The GeneXpert MRSA Assay (FDA approved for NASAL specimens only), is one component of a comprehensive MRSA colonization surveillance program. It is not intended to diagnose MRSA infection nor to guide or monitor treatment for MRSA infections.      Labs: Basic Metabolic Panel:  Recent Labs Lab 02/22/14 0500  02/24/14 0449 02/25/14 0522 02/26/14 0440 02/27/14 0550 02/28/14 0528  NA 138  < > 135 139 135 135 138  K 3.5  < > 3.6 4.1 3.3* 3.7 3.3*  CL 104  < > 105 108 109 105 103  CO2 25  < > 24 23 22 23 27   GLUCOSE 117*  < > 94 105* 121* 97 95  BUN 6  < > 10 13 14 16 18   CREATININE 0.87  < > 2.71* 2.75* 2.59* 2.34* 2.19*  CALCIUM 8.4  < > 8.3* 8.5 8.1* 8.6 8.9  MG 1.7  --   --   --   --   --   --   < > = values in this interval not displayed. Liver Function Tests:  Recent Labs Lab 02/23/14 0500  AST 16  ALT 17  ALKPHOS 62  BILITOT 0.9  PROT 5.6*  ALBUMIN 3.1*   No results for input(s): LIPASE, AMYLASE in the last 168 hours. No results for input(s): AMMONIA in the last 168 hours. CBC:  Recent Labs Lab 02/23/14 0500 02/24/14 0449 02/25/14 0522 02/26/14 0440 02/27/14 0550 02/28/14 0528  WBC 8.5 8.4 9.0 9.7 10.5 12.4*  NEUTROABS 5.5  --   --   --   --   --   HGB 7.4* 7.3* 7.7* 8.1* 8.3* 9.9*  HCT 22.2* 21.6* 22.7* 23.5* 24.1* 29.3*  MCV 90.2 90.8 90.4 90.7 89.3 90.2  PLT 163 176  189 202 216 318   Cardiac Enzymes: No results for input(s): CKTOTAL, CKMB, CKMBINDEX, TROPONINI in the last 168 hours. BNP: BNP (last 3 results)  Recent Labs  02/20/14 1743  BNP 31.5    ProBNP (last 3 results) No results for input(s): PROBNP in the last 8760 hours.  CBG:  Recent Labs Lab 02/22/14 0851 02/23/14 0944 02/24/14 0732 02/25/14 0744 02/26/14 0736  GLUCAP 90 90 102* 92 107*     Additional labs: 1. Pathology 02/26/14: Diagnosis 1. Duodenum, Biopsy - CHRONIC DUODENITIS CONSISTENT WITH PEPTIC DUODENITIS. NO VILLOUS ATROPHY. HELICOBACTER PYLORI OR MALIGNANCY IDENTIFIED 2. Stomach, biopsy - GASTRIC BODY-TYPE MUCOSA WITH ASSOCIATED MINIMAL CHRONIC INFLAMMATION. - NO EVIDENCE OF HELICOBACTER PYLORI, INTESTINAL METAPLASIA, DYSPLASIA OR MALIGNANCY.  2. Pathology 02/23/14: Diagnosis Soft Tissue Needle Core Biopsy, LUQ MATURE ADIPOSE TISSUE AND BLOOD CLOT.  3. Ferritin: 285, iron 18, TIBC 242, saturation ratios 7, B-12: 564, haptoglobin 237, manual reticulocyte count 66, RBC folate: 1350 4. HIV antibody screen: Non-reactive. 5. Blood alcohol level <5 6. UDS: Positive for opiates and THC. 7. EKG 02/20/14: Sinus rhythm at 66 bpm without acute findings.   Signed:  Vernell Leep, MD, FACP, FHM. [facility] Pager [phone]  If 7PM-7AM, please contact night-coverage [email or web address] Password [facility] 02/28/2014, 1:03 PM

## 2014-02-28 NOTE — Discharge Instructions (Signed)
Anemia, Nonspecific Anemia is a condition in which the concentration of red blood cells or hemoglobin in the blood is below normal. Hemoglobin is a substance in red blood cells that carries oxygen to the tissues of the body. Anemia results in not enough oxygen reaching these tissues.  CAUSES  Common causes of anemia include:   Excessive bleeding. Bleeding may be internal or external. This includes excessive bleeding from periods (in women) or from the intestine.   Poor nutrition.   Chronic kidney, thyroid, and liver disease.  Bone marrow disorders that decrease red blood cell production.  Cancer and treatments for cancer.  HIV, AIDS, and their treatments.  Spleen problems that increase red blood cell destruction.  Blood disorders.  Excess destruction of red blood cells due to infection, medicines, and autoimmune disorders. SIGNS AND SYMPTOMS   Minor weakness.   Dizziness.   Headache.  Palpitations.   Shortness of breath, especially with exercise.   Paleness.  Cold sensitivity.  Indigestion.  Nausea.  Difficulty sleeping.  Difficulty concentrating. Symptoms may occur suddenly or they may develop slowly.  DIAGNOSIS  Additional blood tests are often needed. These help your health care provider determine the best treatment. Your health care provider will check your stool for blood and look for other causes of blood loss.  TREATMENT  Treatment varies depending on the cause of the anemia. Treatment can include:   Supplements of iron, vitamin B12, or folic acid.   Hormone medicines.   A blood transfusion. This may be needed if blood loss is severe.   Hospitalization. This may be needed if there is significant continual blood loss.   Dietary changes.  Spleen removal. HOME CARE INSTRUCTIONS Keep all follow-up appointments. It often takes many weeks to correct anemia, and having your health care provider check on your condition and your response to  treatment is very important. SEEK IMMEDIATE MEDICAL CARE IF:   You develop extreme weakness, shortness of breath, or chest pain.   You become dizzy or have trouble concentrating.  You develop heavy vaginal bleeding.   You develop a rash.   You have bloody or black, tarry stools.   You faint.   You vomit up blood.   You vomit repeatedly.   You have abdominal pain.  You have a fever or persistent symptoms for more than 2-3 days.   You have a fever and your symptoms suddenly get worse.   You are dehydrated.  MAKE SURE YOU:  Understand these instructions.  Will watch your condition.  Will get help right away if you are not doing well or get worse. Document Released: 01/27/2004 Document Revised: 08/21/2012 Document Reviewed: 06/14/2012 Exit[employer] Patient Information 2015 [employer]. This information is not intended to replace advice given to you by your health care provider. Make sure you discuss any questions you have with your health care provider.  

## 2014-02-28 NOTE — Progress Notes (Signed)
  [facility] Progress Note   Subjective: feeling good, keeping food and water  Filed Vitals:   02/27/14 1434 02/27/14 1436 02/27/14 2042 02/28/14 0544  BP: 141/66 154/93 148/65 170/89  Pulse: 56  65 71  Temp:   98.3 F (36.8 C) 99 F (37.2 C)  TempSrc:   Oral Oral  Resp:   18 18  Height:      Weight:    76.975 kg (169 lb 11.2 oz)  SpO2:   98% 100%   Exam: Alert, no distress No jvd Chest clear bilat RRR no MRG Abd soft, NTND, no mass or HSM No LE edema Neuro is alert, nf, Ox 3  Renal US ^'d echo c/w medical renal disease, o/w normal US UA 2/19 negative UA 2/23 - 1.009, 6.0, neg protein, 0-2 WBC/RBC per HPF UNa 86, UCr 76       Assessment: 1. Acute kidney injury / ATN - due to IV contrast/ vol depletion/ NSAID's; continue to improve. Good UOP off of IVF's  Stable from renal standpoint, expect continued improvement. Has OP f/u arranged, will sign off.  2. Abdominal mass - prob abd bleed from trauma; surg planning OP f/u for repeat scan in about 6wks 3. Anemia - big Hb drop from hemodilution in part, stable now 4. SIRS - cx's negative, WBC now normal, off abx  Plan - as above    Vinson Moselleob Teyana Pierron MD  pager [phone]    cell [phone]  02/28/2014, 8:22 AM     Recent Labs Lab 02/26/14 0440 02/27/14 0550 02/28/14 0528  NA 135 135 138  K 3.3* 3.7 3.3*  CL 109 105 103  CO2 22 23 27   GLUCOSE 121* 97 95  BUN 14 16 18   CREATININE 2.59* 2.34* 2.19*  CALCIUM 8.1* 8.6 8.9    Recent Labs Lab 02/23/14 0500  AST 16  ALT 17  ALKPHOS 62  BILITOT 0.9  PROT 5.6*  ALBUMIN 3.1*    Recent Labs Lab 02/23/14 0500  02/26/14 0440 02/27/14 0550 02/28/14 0528  WBC 8.5  < > 9.7 10.5 12.4*  NEUTROABS 5.5  --   --   --   --   HGB 7.4*  < > 8.1* 8.3* 9.9*  HCT 22.2*  < > 23.5* 24.1* 29.3*  MCV 90.2  < > 90.7 89.3 90.2  PLT 163  < > 202 216 318  < > = values in this interval not displayed. . ferrous sulfate  325 mg Oral BID WC  . pantoprazole  40 mg Oral  Daily   . sodium chloride 500 mL (02/26/14 1237)   acetaminophen **OR** acetaminophen, HYDROcodone-acetaminophen, HYDROmorphone (DILAUDID) injection, ondansetron **OR** ondansetron (ZOFRAN) IV

## 2014-03-01 LAB — TISSUE TRANSGLUTAMINASE, IGA: Tissue Transglutaminase Ab, IgA: <2 U/mL (ref 0–3)

## 2014-03-03 ENCOUNTER — Encounter: Payer: Self-pay | Admitting: *Deleted

## 2014-03-03 ENCOUNTER — Encounter: Payer: Self-pay | Admitting: Gastroenterology

## 2014-03-26 ENCOUNTER — Other Ambulatory Visit: Payer: Self-pay | Admitting: Surgery

## 2014-03-26 DIAGNOSIS — R233 Spontaneous ecchymoses: Secondary | ICD-10-CM

## 2014-03-26 DIAGNOSIS — R1900 Intra-abdominal and pelvic swelling, mass and lump, unspecified site: Secondary | ICD-10-CM

## 2014-05-04 ENCOUNTER — Other Ambulatory Visit: Payer: Self-pay

## 2014-05-04 ENCOUNTER — Other Ambulatory Visit (INDEPENDENT_AMBULATORY_CARE_PROVIDER_SITE_OTHER): Payer: Self-pay

## 2014-05-08 ENCOUNTER — Other Ambulatory Visit: Payer: Self-pay

## 2014-09-02 ENCOUNTER — Emergency Department (HOSPITAL_COMMUNITY)
Admission: EM | Admit: 2014-09-02 | Discharge: 2014-09-02 | Disposition: A | Discharge status: Home / Self Care | Payer: Self-pay | Attending: Emergency Medicine | Admitting: Emergency Medicine

## 2014-09-02 ENCOUNTER — Encounter (HOSPITAL_COMMUNITY): Payer: Self-pay | Admitting: *Deleted

## 2014-09-02 ENCOUNTER — Emergency Department (HOSPITAL_COMMUNITY): Payer: Self-pay

## 2014-09-02 ENCOUNTER — Emergency Department (HOSPITAL_COMMUNITY): Payer: Medicaid Other

## 2014-09-02 DIAGNOSIS — Z8659 Personal history of other mental and behavioral disorders: Secondary | ICD-10-CM | POA: Insufficient documentation

## 2014-09-02 DIAGNOSIS — Z720 Tobacco use: Secondary | ICD-10-CM | POA: Insufficient documentation

## 2014-09-02 DIAGNOSIS — R112 Nausea with vomiting, unspecified: Secondary | ICD-10-CM | POA: Insufficient documentation

## 2014-09-02 DIAGNOSIS — R1013 Epigastric pain: Secondary | ICD-10-CM | POA: Insufficient documentation

## 2014-09-02 LAB — CBC
HEMATOCRIT: 41.2 % (ref 39.0–52.0)
Hemoglobin: 13.7 g/dL (ref 13.0–17.0)
MCH: 29.7 pg (ref 26.0–34.0)
MCHC: 33.3 g/dL (ref 30.0–36.0)
MCV: 89.4 fL (ref 78.0–100.0)
Platelets: 224 10*3/uL (ref 150–400)
RBC: 4.61 MIL/uL (ref 4.22–5.81)
RDW: 13.3 % (ref 11.5–15.5)
WBC: 6.8 10*3/uL (ref 4.0–10.5)

## 2014-09-02 LAB — I-STAT TROPONIN, ED: TROPONIN I, POC: 0 ng/mL (ref 0.00–0.08)

## 2014-09-02 LAB — BASIC METABOLIC PANEL
Anion gap: 7 (ref 5–15)
BUN: 10 mg/dL (ref 6–20)
CALCIUM: 9.4 mg/dL (ref 8.9–10.3)
CO2: 27 mmol/L (ref 22–32)
Chloride: 106 mmol/L (ref 101–111)
Creatinine, Ser: 0.84 mg/dL (ref 0.61–1.24)
GFR calc Af Amer: >60 mL/min (ref >60)
GLUCOSE: 106 mg/dL — AB (ref 65–99)
POTASSIUM: 3.7 mmol/L (ref 3.5–5.1)
SODIUM: 140 mmol/L (ref 135–145)

## 2014-09-02 MED ORDER — OMEPRAZOLE 20 MG PO CPDR: 20.0000 mg | DELAYED_RELEASE_CAPSULE | Freq: Every day | ORAL | Status: DC

## 2014-09-02 MED ORDER — IOHEXOL 300 MG/ML  SOLN
25.0000 mL | Freq: Once | INTRAMUSCULAR | Status: AC | PRN
  Administered 2014-09-02: 25 mL via ORAL

## 2014-09-02 MED ORDER — IOHEXOL 300 MG/ML  SOLN
80.0000 mL | Freq: Once | INTRAMUSCULAR | Status: AC | PRN
  Administered 2014-09-02: 100 mL via INTRAVENOUS

## 2014-09-02 NOTE — ED Provider Notes (Signed)
CSN: [id number]     Arrival date & time 09/02/14  1423 History   First MD Initiated Contact with Patient 09/02/14 1636     Chief Complaint  Patient presents with  . Chest Pain     (Consider location/radiation/quality/duration/timing/severity/associated sxs/prior Treatment) HPI Patient presents with concern of worsening, persistent epigastric, chest pain. There is associated nausea, vomiting. Symptoms have been present for some time, since evaluation here months ago, during which was found to have soft tissue lesion in his upper abdomen, adjacent to the stomach. Patient is under the relief that it was a chest lesion, but on chart review, Mrs. Demetrius Charity Today, patient states that over the past few weeks in particular he has had persistent, worsening epigastric, chest pain, worse with motion, stretching. There is associated new nausea, with vomiting. No new confusion, disorientation, asymmetric weakness, or cold sensation. No clear alleviating, exacerbating, relieving factors. Patient has attempted to follow-up, but has not successfully had outpatient CT scan, nor seen his primary care or surgical team. Past Medical History  Diagnosis Date  . ADHD (attention deficit hyperactivity disorder)    Past Surgical History  Procedure Laterality Date  . Knee surgery    . Abdominal surgery      when infant  . Esophagogastroduodenoscopy N/A 02/26/2014    Procedure: ESOPHAGOGASTRODUODENOSCOPY (EGD);  Surgeon: Meryl Dare, MD;  Location: [facility];  Service: Endoscopy;  Laterality: N/A;   No family history on file. Social History  Substance Use Topics  . Smoking status: Current Some Day Smoker    Types: Cigars  . Smokeless tobacco: None  . Alcohol Use: Yes     Comment: occasionally    Review of Systems  Constitutional:       Per HPI, otherwise negative  HENT:       Per HPI, otherwise negative  Respiratory:       Per HPI, otherwise negative  Cardiovascular:       Per HPI, otherwise  negative  Gastrointestinal: Positive for nausea and vomiting.  Endocrine:       Negative aside from HPI  Genitourinary:       Neg aside from HPI   Musculoskeletal:       Per HPI, otherwise negative  Skin: Negative.   Neurological: Negative for syncope.      Allergies  Review of patient's allergies indicates no known allergies.  Home Medications   Prior to Admission medications   Not on File   BP 118/66 mmHg  Pulse 72  Temp(Src) 97.6 F (36.4 C) (Oral)  Resp 16  SpO2 98% Physical Exam  Constitutional: He is oriented to person, place, and time. He appears well-developed. No distress.  HENT:  Head: Normocephalic and atraumatic.  Eyes: Conjunctivae and EOM are normal.  Cardiovascular: Normal rate and regular rhythm.   Pulmonary/Chest: Effort normal. No stridor. No respiratory distress.  Abdominal: He exhibits no distension.  Mild tenderness about the epigastrium  Musculoskeletal: He exhibits no edema.  Neurological: He is alert and oriented to person, place, and time.  Skin: Skin is warm and dry.  Psychiatric: He has a normal mood and affect.  Nursing note and vitals reviewed.   ED Course  Procedures (including critical care time) Labs Review Labs Reviewed  BASIC METABOLIC PANEL - Abnormal; Notable for the following:    Glucose, Bld 106 (*)    All other components within normal limits  CBC  I-STAT TROPOININ, ED    Imaging Review Dg Chest 2 View  09/02/2014  CLINICAL DATA:  Three-week history of chest pain  EXAM: CHEST  2 VIEW  COMPARISON:  February 20, 2014  FINDINGS: Lungs are clear. Heart size and pulmonary vascularity are normal. No adenopathy. No pneumothorax. No bone lesions.  IMPRESSION: No abnormality noted.   Electronically Signed   By: Bretta Bang III M.D.   On: 09/02/2014 15:26   Ct Abdomen Pelvis W Contrast  09/02/2014   CLINICAL DATA:  Abdominal pain. History of presumed spontaneous intraperitoneal hemorrhage February 2016, verified at needle  sampling.  EXAM: CT ABDOMEN AND PELVIS WITH CONTRAST  TECHNIQUE: Multidetector CT imaging of the abdomen and pelvis was performed using the standard protocol following bolus administration of intravenous contrast.  CONTRAST:  OMNIPAQUE IOHEXOL 300 MG/ML  SOLN  COMPARISON:  02/20/2014 CT abdomen/pelvis  FINDINGS: Lower chest:  Lung bases are clear.  Hepatobiliary: The liver and gallbladder appear unremarkable.  Pancreas: Normal  Spleen: Normal  Adrenals/Urinary Tract: Adrenal glands and kidneys are unremarkable. Bladder is normal. No radiopaque renal, ureteral, or bladder calculus.  Stomach/Bowel: No bowel wall thickening or focal segmental dilatation is identified. Appendix is normal. Stomach appears normal.  Vascular/Lymphatic: No aortic aneurysm. No lymphadenopathy. Mildly prominent right lower quadrant mesenteric nodes are within normal limits for this young patient.  Other: No free air or fluid. Interval resolution of previously seen intra-abdominal hematoma.  Musculoskeletal: No acute osseous abnormality.  IMPRESSION: No acute intra-abdominal or pelvic pathology.   Electronically Signed   By: Christiana Pellant M.D.   On: 09/02/2014 19:33   I have personally reviewed and evaluated these images and lab results as part of my medical decision-making.   EKG Interpretation   Date/Time:  Wednesday September 02 2014 14:25:07 EDT Ventricular Rate:  78 PR Interval:  158 QRS Duration: 92 QT Interval:  350 QTC Calculation: 399 R Axis:   42 Text Interpretation:  Normal sinus rhythm with sinus arrhythmia Normal ECG  Sinus rhythm Artifact Abnormal ekg Confirmed by Gerhard Munch  MD  [phone]) on 09/02/2014 4:38:27 PM     Chart review demonstrates diagnosis of soft tissue lesion adjacent to the stomach in February, 2016. Patient had multiple studies performed, biopsy performed of the gastric lining, which was hypertrophic, nonmalignant.  7:54 PM CT w reassuring findings. Patient in no distress.  He and  companion are aware of all findings.  MDM  Patient presents with concern of ongoing epigastric, chest pain, increasing nausea, vomiting. Given the patient's history of intra-abdominal lesion several months ago, no CT performed as an outpatient for assurance of resolution, and his increasing symptoms, additional CT imaging was performed. This was reassuring. Patient discharged in stable condition with GI follow-up.  Gerhard Munch, MD 09/02/14 [phone]

## 2014-09-02 NOTE — ED Notes (Addendum)
Pt states that he has had a "blood vessel rupture in his chest that caused a hematoma" over 5 month ago. Pt states that he did not followup for his last CT. Pt states that he lifts a lot of heavy things a work as well. Pt states that he has had central chest pain with occasional vomiting for 3 weeks. Pt states that pain is worse with movements

## 2014-09-02 NOTE — Discharge Instructions (Signed)
.  As discussed, your evaluation today has been largely reassuring.  But, it is important that you monitor your condition carefully, and do not hesitate to return to the ED if you develop new, or concerning changes in your condition.  Your symptoms are likely due to irritation of the stomach lining itself.  Stable medication as directed  Please follow-up with your physician for appropriate ongoing care.   Abdominal Pain Many things can cause abdominal pain. Usually, abdominal pain is not caused by a disease and will improve without treatment. It can often be observed and treated at home. Your health care provider will do a physical exam and possibly order blood tests and X-rays to help determine the seriousness of your pain. However, in many cases, more time must pass before a clear cause of the pain can be found. Before that point, your health care provider may not know if you need more testing or further treatment. HOME CARE INSTRUCTIONS  Monitor your abdominal pain for any changes. The following actions may help to alleviate any discomfort you are experiencing:  Only take over-the-counter or prescription medicines as directed by your health care provider.  Do not take laxatives unless directed to do so by your health care provider.  Try a clear liquid diet (broth, tea, or water) as directed by your health care provider. Slowly move to a bland diet as tolerated. SEEK MEDICAL CARE IF:  You have unexplained abdominal pain.  You have abdominal pain associated with nausea or diarrhea.  You have pain when you urinate or have a bowel movement.  You experience abdominal pain that wakes you in the night.  You have abdominal pain that is worsened or improved by eating food.  You have abdominal pain that is worsened with eating fatty foods.  You have a fever. SEEK IMMEDIATE MEDICAL CARE IF:   Your pain does not go away within 2 hours.  You keep throwing up (vomiting).  Your pain is felt  only in portions of the abdomen, such as the right side or the left lower portion of the abdomen.  You pass bloody or black tarry stools. MAKE SURE YOU:  Understand these instructions.   Will watch your condition.   Will get help right away if you are not doing well or get worse.  Document Released: 09/28/2004 Document Revised: 12/24/2012 Document Reviewed: 08/28/2012 [facility] Patient Information 2015 [place], [place]. This information is not intended to replace advice given to you by your health care provider. Make sure you discuss any questions you have with your health care provider.

## 2015-01-25 ENCOUNTER — Encounter (HOSPITAL_BASED_OUTPATIENT_CLINIC_OR_DEPARTMENT_OTHER): Payer: Self-pay

## 2015-01-25 ENCOUNTER — Emergency Department (HOSPITAL_BASED_OUTPATIENT_CLINIC_OR_DEPARTMENT_OTHER)
Admission: EM | Admit: 2015-01-25 | Discharge: 2015-01-25 | Disposition: A | Discharge status: Home / Self Care | Payer: Medicaid Other | Attending: Emergency Medicine | Admitting: Emergency Medicine

## 2015-01-25 DIAGNOSIS — Z8659 Personal history of other mental and behavioral disorders: Secondary | ICD-10-CM | POA: Insufficient documentation

## 2015-01-25 DIAGNOSIS — K219 Gastro-esophageal reflux disease without esophagitis: Secondary | ICD-10-CM | POA: Insufficient documentation

## 2015-01-25 DIAGNOSIS — J00 Acute nasopharyngitis [common cold]: Secondary | ICD-10-CM

## 2015-01-25 DIAGNOSIS — F17210 Nicotine dependence, cigarettes, uncomplicated: Secondary | ICD-10-CM | POA: Insufficient documentation

## 2015-01-25 DIAGNOSIS — Z79899 Other long term (current) drug therapy: Secondary | ICD-10-CM | POA: Insufficient documentation

## 2015-01-25 HISTORY — DX: Gastro-esophageal reflux disease without esophagitis: K21.9

## 2015-01-25 MED ORDER — CETIRIZINE HCL 10 MG PO TABS: 10.0000 mg | ORAL_TABLET | Freq: Every day | ORAL | Status: DC

## 2015-01-25 MED ORDER — OXYMETAZOLINE HCL 0.05 % NA SOLN
1.0000 | Freq: Once | NASAL | Status: AC
  Administered 2015-01-25: 1 via NASAL
  Filled 2015-01-25: qty 15

## 2015-01-25 NOTE — Discharge Instructions (Signed)
Return to the ED with any concerns including difficulty breathing, vomiting and not able to keep down liquids, decreased urine output, decreased level of alertness/lethargy, or any other alarming symptoms  °

## 2015-01-25 NOTE — ED Provider Notes (Signed)
CSN: [id number]     Arrival date & time 01/25/15  1922 History   First MD Initiated Contact with Patient 01/25/15 2153     Chief Complaint  Patient presents with  . Nasal Congestion     (Consider location/radiation/quality/duration/timing/severity/associated sxs/prior Treatment) HPI  Pt presenting with c/o headache and nasal congesetion.  He states symptoms started 2-3 days ago. He has some sore throat.  No cough.  No vomiting or diarrhea and no fever.  He states he thinks his symptoms may be due to allergies.  He has continued to drink liquids normally.  He tried ibuprofen today and symptoms were not relieved.  No specific sick contacts.  No recent travel.  There are no other associated systemic symptoms, there are no other alleviating or modifying factors.   Past Medical History  Diagnosis Date  . ADHD (attention deficit hyperactivity disorder)   . GERD (gastroesophageal reflux disease)    Past Surgical History  Procedure Laterality Date  . Knee surgery    . Abdominal surgery      when infant  . Esophagogastroduodenoscopy N/A 02/26/2014    Procedure: ESOPHAGOGASTRODUODENOSCOPY (EGD);  Surgeon: Meryl Dare, MD;  Location: [facility];  Service: Endoscopy;  Laterality: N/A;   No family history on file. Social History  Substance Use Topics  . Smoking status: Current Some Day Smoker    Types: Cigars  . Smokeless tobacco: None  . Alcohol Use: Yes     Comment: occasionally    Review of Systems  ROS reviewed and all otherwise negative except for mentioned in HPI    Allergies  Review of patient's allergies indicates no known allergies.  Home Medications   Prior to Admission medications   Medication Sig Start Date End Date Taking? Authorizing Provider  omeprazole (PRILOSEC) 20 MG capsule Take 1 capsule (20 mg total) by mouth daily. 09/02/14  Yes Gerhard Munch, MD  cetirizine (ZYRTEC) 10 MG tablet Take 1 tablet (10 mg total) by mouth daily. 01/25/15   Jerelyn Scott, MD    BP 134/69 mmHg  Pulse 76  Temp(Src) 98.6 F (37 C) (Oral)  Resp 18  Ht  (1.651 m)  Wt 74.844 kg  BMI 27.46 kg/m2  SpO2 98%  Vitals reviewed Physical Exam  Physical Examination: General appearance - alert, well appearing, and in no distress Mental status - alert, oriented to person, place, and time Eyes - no conjunctival injection no scleral icterus Face- no ttp over maxillary or frontal sinuses Mouth - mucous membranes moist, pharynx normal without lesions Neck - supple, no significant adenopathy Chest - clear to auscultation, no wheezes, rales or rhonchi, symmetric air entry Heart - normal rate, regular rhythm, normal S1, S2, no murmurs, rubs, clicks or gallops Neurological - alert, oriented, normal speech Extremities - peripheral pulses normal, no pedal edema, no clubbing or cyanosis Skin - normal coloration and turgor, no rashes  ED Course  Procedures (including critical care time) Labs Review Labs Reviewed - No data to display  Imaging Review No results found. I have personally reviewed and evaluated these images and lab results as part of my medical decision-making.   EKG Interpretation None      MDM   Final diagnoses:  Head cold    Pt presenting with c/o nasal congestion and headache.  Symptoms are most c/w viral URI.  No fever.   Patient is overall nontoxic and well hydrated in appearance.  Pt given afrin nasal spray and zyrtec to help with symptoms.  Discharged  with strict return precautions.  Pt agreeable with plan.     Jerelyn Scott, MD 01/28/15 [phone]

## 2015-01-25 NOTE — ED Notes (Signed)
Pt given d/c instructions. Verbalizes understanding. No questions. 

## 2015-01-25 NOTE — ED Notes (Signed)
Pt c/o headache and congestion not relieved by  of ibuprofen at 1400 today.  No n/v/d, no cough.

## 2015-03-02 ENCOUNTER — Encounter (HOSPITAL_COMMUNITY): Payer: Self-pay | Admitting: Cardiology

## 2015-03-02 ENCOUNTER — Emergency Department (HOSPITAL_COMMUNITY)
Admission: EM | Admit: 2015-03-02 | Discharge: 2015-03-02 | Disposition: A | Discharge status: Home / Self Care | Payer: Medicaid Other | Attending: Emergency Medicine | Admitting: Emergency Medicine

## 2015-03-02 DIAGNOSIS — K219 Gastro-esophageal reflux disease without esophagitis: Secondary | ICD-10-CM | POA: Insufficient documentation

## 2015-03-02 DIAGNOSIS — F17210 Nicotine dependence, cigarettes, uncomplicated: Secondary | ICD-10-CM | POA: Insufficient documentation

## 2015-03-02 DIAGNOSIS — Z8659 Personal history of other mental and behavioral disorders: Secondary | ICD-10-CM | POA: Insufficient documentation

## 2015-03-02 DIAGNOSIS — J111 Influenza due to unidentified influenza virus with other respiratory manifestations: Secondary | ICD-10-CM

## 2015-03-02 LAB — CBC
HCT: 42.6 % (ref 39.0–52.0)
Hemoglobin: 14.8 g/dL (ref 13.0–17.0)
MCH: 31.2 pg (ref 26.0–34.0)
MCHC: 34.7 g/dL (ref 30.0–36.0)
MCV: 89.7 fL (ref 78.0–100.0)
Platelets: 179 K/uL (ref 150–400)
RBC: 4.75 MIL/uL (ref 4.22–5.81)
RDW: 13 % (ref 11.5–15.5)
WBC: 6.4 K/uL (ref 4.0–10.5)

## 2015-03-02 LAB — COMPREHENSIVE METABOLIC PANEL
ALT: 34 U/L (ref 17–63)
ANION GAP: 14 (ref 5–15)
AST: 32 U/L (ref 15–41)
Albumin: 4.5 g/dL (ref 3.5–5.0)
Alkaline Phosphatase: 84 U/L (ref 38–126)
BUN: 8 mg/dL (ref 6–20)
CHLORIDE: 101 mmol/L (ref 101–111)
CO2: 22 mmol/L (ref 22–32)
Calcium: 9.8 mg/dL (ref 8.9–10.3)
Creatinine, Ser: 1.1 mg/dL (ref 0.61–1.24)
GFR calc non Af Amer: >60 mL/min (ref >60)
Glucose, Bld: 103 mg/dL — ABNORMAL HIGH (ref 65–99)
POTASSIUM: 4 mmol/L (ref 3.5–5.1)
SODIUM: 137 mmol/L (ref 135–145)
Total Bilirubin: 0.7 mg/dL (ref 0.3–1.2)
Total Protein: 7.3 g/dL (ref 6.5–8.1)

## 2015-03-02 MED ORDER — ONDANSETRON 4 MG PO TBDP: 4.0000 mg | ORAL_TABLET | Freq: Three times a day (TID) | ORAL | Status: DC | PRN

## 2015-03-02 MED ORDER — ONDANSETRON 4 MG PO TBDP
4.0000 mg | ORAL_TABLET | Freq: Once | ORAL | Status: AC
  Administered 2015-03-02: 4 mg via ORAL
  Filled 2015-03-02: qty 1

## 2015-03-02 MED ORDER — HYDROCODONE-ACETAMINOPHEN 5-325 MG PO TABS
1.0000 | ORAL_TABLET | Freq: Once | ORAL | Status: AC
  Administered 2015-03-02: 1 via ORAL
  Filled 2015-03-02: qty 1

## 2015-03-02 MED ORDER — GUAIFENESIN-CODEINE 100-10 MG/5ML PO SYRP: 5.0000 mL | ORAL_SOLUTION | Freq: Three times a day (TID) | ORAL | Status: AC | PRN

## 2015-03-02 NOTE — Discharge Instructions (Signed)

## 2015-03-02 NOTE — ED Notes (Signed)
Pt reports vomiting, fever, and cough over the past couple of days. No sick contacts at home. Reports fever has been around 102 at home.

## 2015-03-02 NOTE — ED Notes (Signed)
Pt verbalizes understganding of instruction.

## 2015-03-02 NOTE — ED Provider Notes (Signed)
CSN: [id number]     Arrival date & time 03/02/15  1131 History   First MD Initiated Contact with Patient 03/02/15 1332     Chief Complaint  Patient presents with  . Emesis  . Fever  . Cough      HPI  Pt with 2 days of cough, fever, headache.  Nausea, vomiting x 1.  No flu shot.  No neck stiffness. No rash. No abd pain.  Past Medical History  Diagnosis Date  . ADHD (attention deficit hyperactivity disorder)   . GERD (gastroesophageal reflux disease)    Past Surgical History  Procedure Laterality Date  . Knee surgery    . Abdominal surgery      when infant  . Esophagogastroduodenoscopy N/A 02/26/2014    Procedure: ESOPHAGOGASTRODUODENOSCOPY (EGD);  Surgeon: Meryl Dare, MD;  Location: [facility];  Service: Endoscopy;  Laterality: N/A;   History reviewed. No pertinent family history. Social History  Substance Use Topics  . Smoking status: Current Some Day Smoker    Types: Cigars  . Smokeless tobacco: None  . Alcohol Use: Yes     Comment: occasionally    Review of Systems  Constitutional: Positive for fever. Negative for chills, diaphoresis, appetite change and fatigue.  HENT: Negative for mouth sores, sore throat and trouble swallowing.   Eyes: Negative for visual disturbance.  Respiratory: Negative for cough, chest tightness, shortness of breath and wheezing.   Cardiovascular: Negative for chest pain.  Gastrointestinal: Positive for nausea and vomiting. Negative for abdominal pain, diarrhea and abdominal distention.  Endocrine: Negative for polydipsia, polyphagia and polyuria.  Genitourinary: Negative for dysuria, frequency and hematuria.  Musculoskeletal: Negative for gait problem.  Skin: Negative for color change, pallor and rash.  Neurological: Positive for headaches. Negative for dizziness, syncope and light-headedness.  Hematological: Does not bruise/bleed easily.  Psychiatric/Behavioral: Negative for behavioral problems and confusion.      Allergies   Review of patient's allergies indicates no known allergies.  Home Medications   Prior to Admission medications   Medication Sig Start Date End Date Taking? Authorizing Provider  acetaminophen (TYLENOL) 500 MG tablet Take 500 mg by mouth once.   Yes Historical Provider, MD  cetirizine (ZYRTEC) 10 MG tablet Take 1 tablet (10 mg total) by mouth daily. Patient taking differently: Take 10 mg by mouth daily as needed for allergies.  01/25/15  Yes Jerelyn Scott, MD  guaiFENesin-codeine [facility]) 100-10 MG/5ML syrup Take 5 mLs by mouth 3 (three) times daily as needed for cough. 03/02/15   Rolland Porter, MD  omeprazole (PRILOSEC) 20 MG capsule Take 1 capsule (20 mg total) by mouth daily. Patient not taking: Reported on 03/02/2015 09/02/14   Gerhard Munch, MD  ondansetron (ZOFRAN ODT) 4 MG disintegrating tablet Take 1 tablet (4 mg total) by mouth every 8 (eight) hours as needed for nausea. 03/02/15   Rolland Porter, MD   BP 129/69 mmHg  Pulse 80  Temp(Src) 98.7 F (37.1 C) (Oral)  Resp 16  Wt 165 lb (74.844 kg)  SpO2 100% Physical Exam  Constitutional: He is oriented to person, place, and time. He appears well-developed and well-nourished. No distress.  HENT:  Head: Normocephalic.  Eyes: Conjunctivae are normal. Pupils are equal, round, and reactive to light. No scleral icterus.  Neck: Normal range of motion. Neck supple. No thyromegaly present.  Cardiovascular: Normal rate and regular rhythm.  Exam reveals no gallop and no friction rub.   No murmur heard. Pulmonary/Chest: Effort normal and breath sounds normal. No  respiratory distress. He has no wheezes. He has no rales.  Abdominal: Soft. Bowel sounds are normal. He exhibits no distension. There is no tenderness. There is no rebound.  Musculoskeletal: Normal range of motion.  Neurological: He is alert and oriented to person, place, and time.  Skin: Skin is warm and dry. No rash noted.  Psychiatric: He has a normal mood and affect. His behavior  is normal.    ED Course  Procedures (including critical care time) Labs Review Labs Reviewed  COMPREHENSIVE METABOLIC PANEL - Abnormal; Notable for the following:    Glucose, Bld 103 (*)    All other components within normal limits  CBC    Imaging Review No results found. I have personally reviewed and evaluated these images and lab results as part of my medical decision-making.   EKG Interpretation None      MDM   Final diagnoses:  Influenza    Pt asymptomatic here, other than headache.  Afebrile, not hypoxemic.  Not immunized for influenza. Plan home, symptomatic treatment, expectant management.Rolland Porter, MD 03/02/15 [phone]

## 2015-03-02 NOTE — ED Notes (Signed)
Pt states took 4 tylenol at 1000

## 2015-12-21 IMAGING — US US RENAL
1 series · 14 of 25 positions shown · non-contrast
Comparison: None.

CLINICAL DATA: Acute renal failure.

EXAM:
RENAL/URINARY TRACT ULTRASOUND COMPLETE

[Series 1: us renal · 0.28mm/px · 14 of 30 slices shown]
[im 1/30]
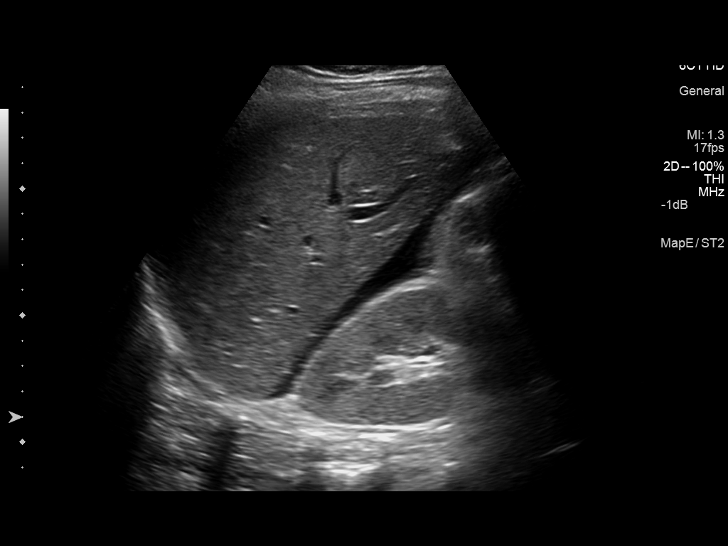
[im 3/30]
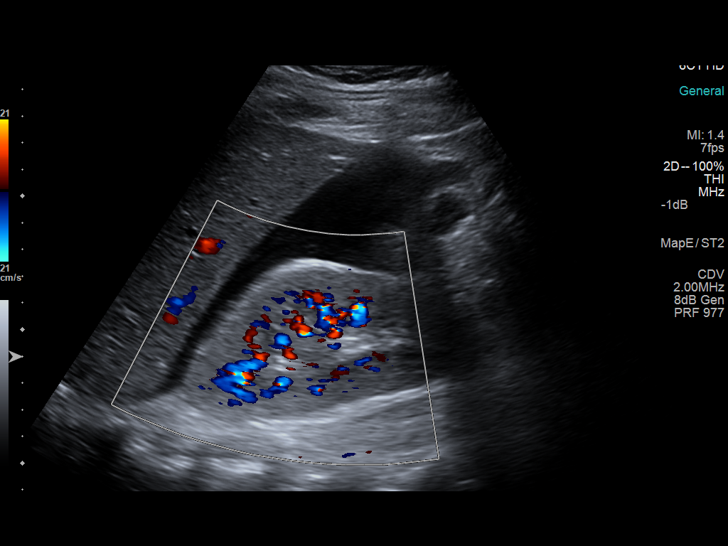
[im 5/30]
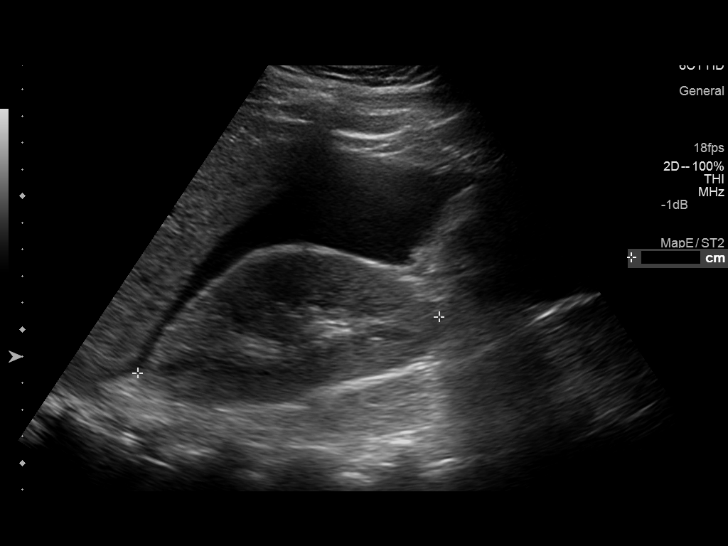
[im 8/30]
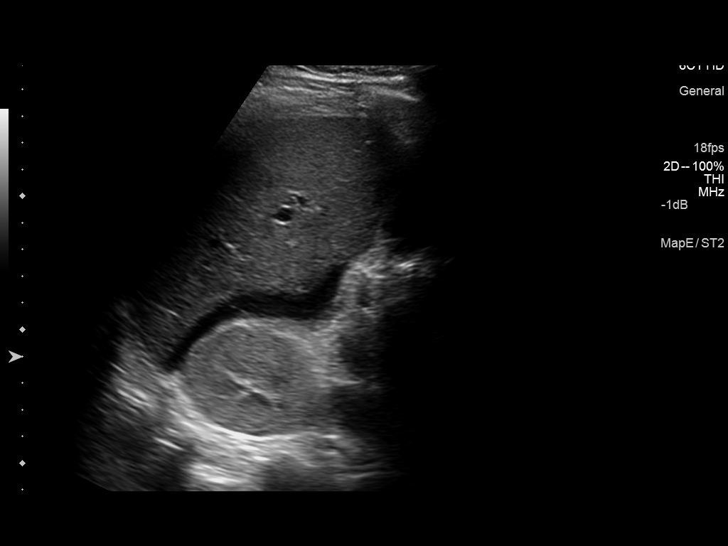
[im 10/30]
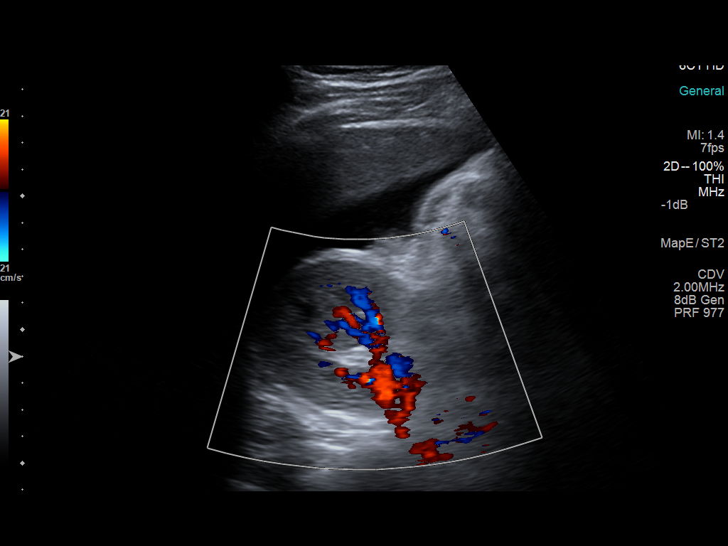
[im 11/30]
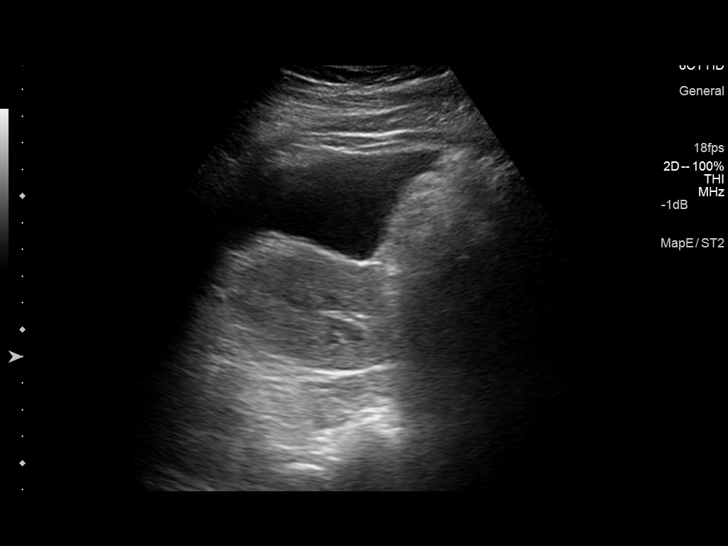
[im 14/30]
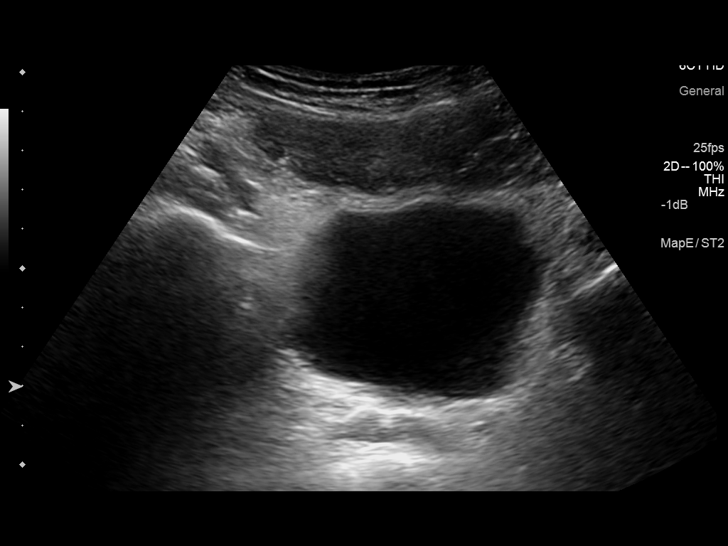
[im 16/30]
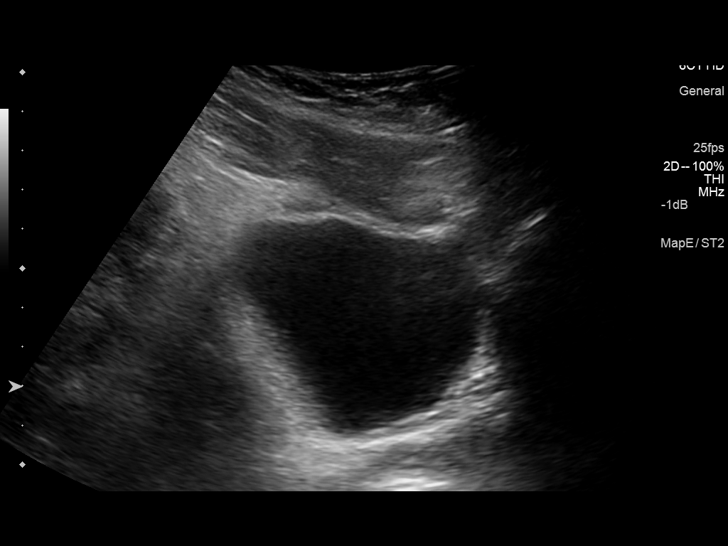
[im 19/30]
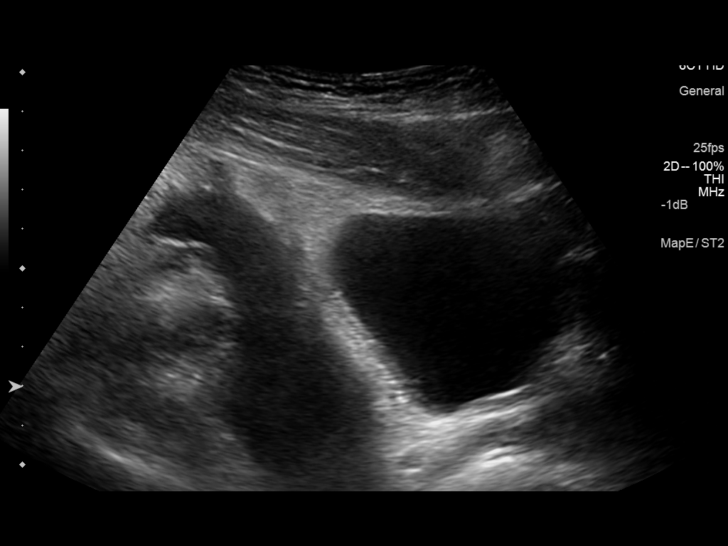
[im 20/30]
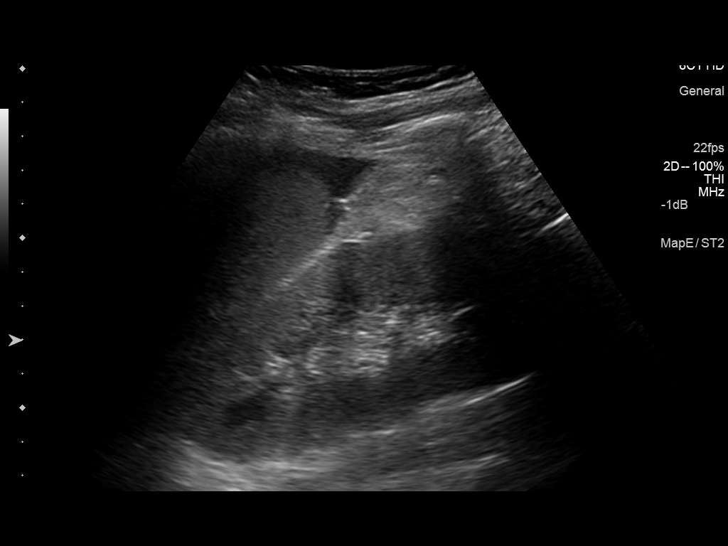
[im 22/30]
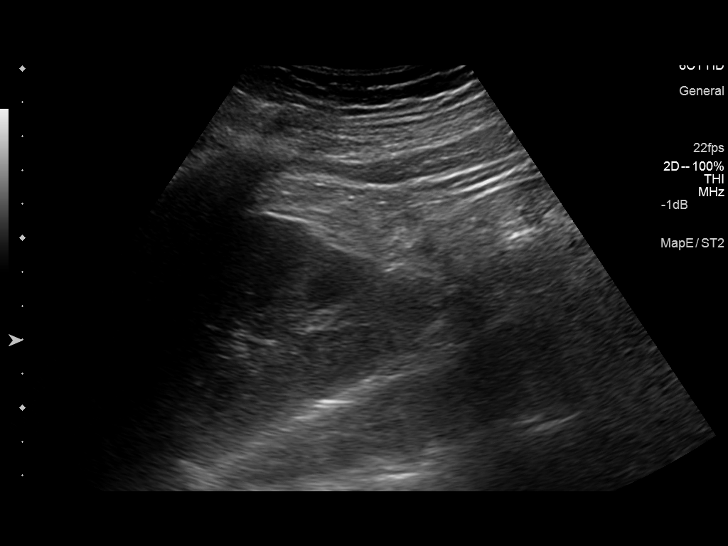
[im 25/30]
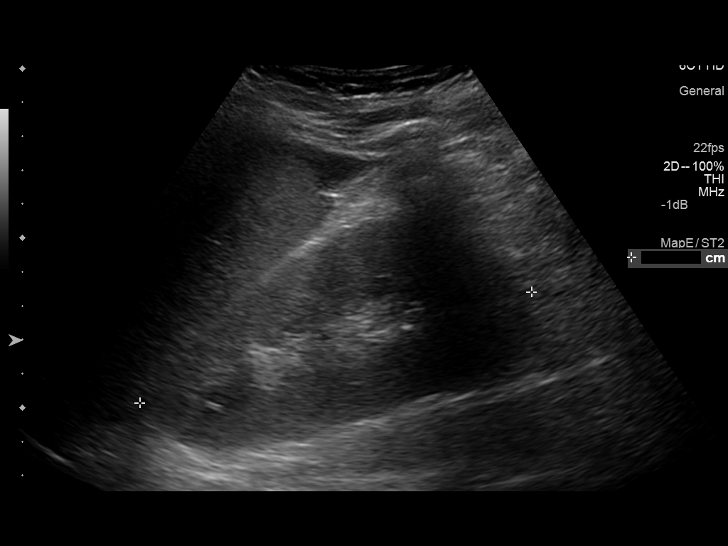
[im 27/30]
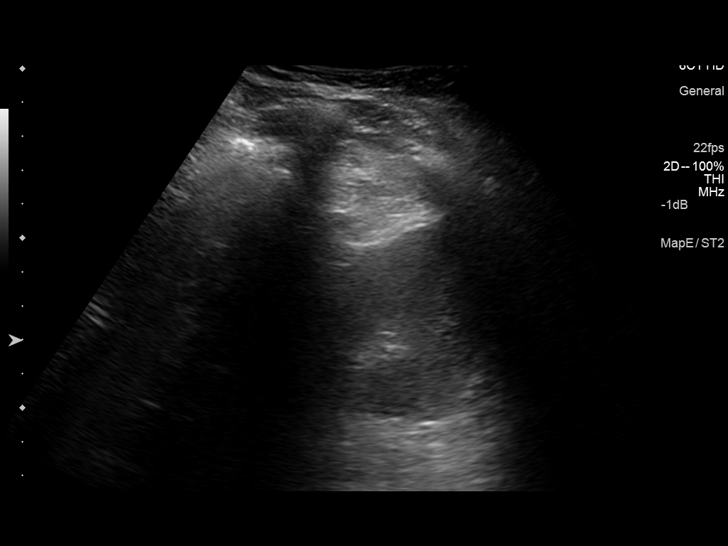
[im 30/30]
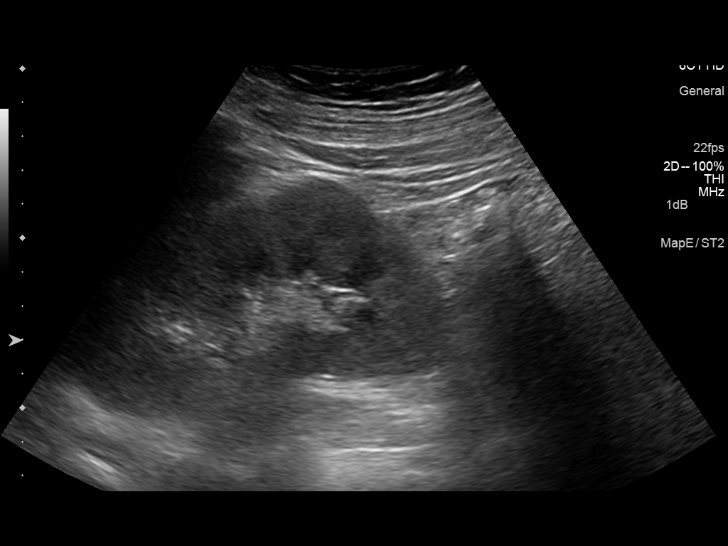

[14 of 25 positions shown; findings below may reference images not displayed]

FINDINGS: Right Kidney:

Length: 11.5 cm. Increased parenchymal echogenicity. No renal mass
or stone. No hydronephrosis.

Left Kidney:

Length: 12.0 cm. Increased parenchymal echogenicity. No renal mass
or stone. No hydronephrosis.

Bladder:

Appears normal for degree of bladder distention.

Small amount of ascites is seen along the inferior margin of the
liver.
IMPRESSION: 1. No hydronephrosis.
2. Increased renal parenchymal echogenicity bilaterally consistent
with medical renal disease.
3. Small amount of ascites.
4. No other abnormalities.

## 2016-05-20 ENCOUNTER — Emergency Department (HOSPITAL_COMMUNITY)
Admission: EM | Admit: 2016-05-20 | Discharge: 2016-05-20 | Disposition: A | Discharge status: Home / Self Care | Payer: Medicaid Other | Attending: Emergency Medicine | Admitting: Emergency Medicine

## 2016-05-20 ENCOUNTER — Encounter (HOSPITAL_COMMUNITY): Payer: Self-pay | Admitting: Emergency Medicine

## 2016-05-20 DIAGNOSIS — J301 Allergic rhinitis due to pollen: Secondary | ICD-10-CM

## 2016-05-20 DIAGNOSIS — F17290 Nicotine dependence, other tobacco product, uncomplicated: Secondary | ICD-10-CM | POA: Insufficient documentation

## 2016-05-20 DIAGNOSIS — F909 Attention-deficit hyperactivity disorder, unspecified type: Secondary | ICD-10-CM | POA: Insufficient documentation

## 2016-05-20 DIAGNOSIS — Z79899 Other long term (current) drug therapy: Secondary | ICD-10-CM | POA: Insufficient documentation

## 2016-05-20 MED ORDER — CETIRIZINE HCL 10 MG PO TABS: 10.0000 mg | ORAL_TABLET | Freq: Every day | ORAL | 0 refills | Status: DC | PRN

## 2016-05-20 MED ORDER — FLUTICASONE PROPIONATE 50 MCG/ACT NA SUSP: 1.0000 | Freq: Every day | NASAL | 0 refills | Status: DC

## 2016-05-20 NOTE — ED Notes (Signed)
Declined W/C at D/C and was escorted to lobby by RN. 

## 2016-05-20 NOTE — ED Provider Notes (Signed)
[facility]-EMERGENCY DEPT Provider Note    By signing my name below, I, Earmon Phoenix, attest that this documentation has been prepared under the direction and in the presence of Fayrene Helper, PA-C. Electronically Signed: Earmon Phoenix, ED Scribe. 05/20/16. 11:09 AM.    History   Chief Complaint Chief Complaint  Patient presents with  . Sore Throat  . Nasal Congestion  . Cough    The history is provided by the patient and medical records. No language interpreter was used.    Omar Sheppard is a 21 y.o. male who presents to the Emergency Department complaining of seasonal allergies that began three days ago. He reports associated sore throat, HA, congestion, hoarseness, sneezing and cough. He denies any new soaps, lotions, detergents, personal hygiene items or pets. He has taken Benadryl and using nasal spray and cough drops for his symptoms with relief. There are no modifying factors noted. He denies fever, chills, nausea, vomiting, abdominal pain, rash, itchy eyes. He is here at the urging of his work place.  Pt drives a fork lift.  Pt felt this is his allergy sxs.   Past Medical History:  Diagnosis Date  . ADHD (attention deficit hyperactivity disorder)   . GERD (gastroesophageal reflux disease)     Patient Active Problem List   Diagnosis Date Noted  . Occult blood in stools 02/26/2014  . Iron deficiency anemia 02/26/2014  . Abdominal pain 02/26/2014  . Abdominal mass, LUQ (left upper quadrant)   . Abdominal mass 02/20/2014  . Syncope 02/20/2014  . Bradycardia 02/20/2014  . Sepsis (HCC) 02/20/2014    Past Surgical History:  Procedure Laterality Date  . ABDOMINAL SURGERY     when infant  . ESOPHAGOGASTRODUODENOSCOPY N/A 02/26/2014   Procedure: ESOPHAGOGASTRODUODENOSCOPY (EGD);  Surgeon: Meryl Dare, MD;  Location: [facility];  Service: Endoscopy;  Laterality: N/A;  . KNEE SURGERY         Home Medications    Prior to Admission medications     Medication Sig Start Date End Date Taking? Authorizing Provider  acetaminophen (TYLENOL) 500 MG tablet Take 500 mg by mouth once.    [provider]  cetirizine (ZYRTEC) 10 MG tablet Take 1 tablet (10 mg total) by mouth daily as needed for allergies. 05/20/16   Fayrene Helper, PA-C  fluticasone (FLONASE) 50 MCG/ACT nasal spray Place 1 spray into both nostrils daily. 05/20/16   Fayrene Helper, PA-C  guaiFENesin-codeine (ROBITUSSIN AC) 100-10 MG/5ML syrup Take 5 mLs by mouth 3 (three) times daily as needed for cough. 03/02/15   Rolland Porter, MD  omeprazole (PRILOSEC) 20 MG capsule Take 1 capsule (20 mg total) by mouth daily. Patient not taking: Reported on 03/02/2015 09/02/14   Gerhard Munch, MD  ondansetron (ZOFRAN ODT) 4 MG disintegrating tablet Take 1 tablet (4 mg total) by mouth every 8 (eight) hours as needed for nausea. 03/02/15   Rolland Porter, MD    Family History No family history on file.  Social History Social History  Substance Use Topics  . Smoking status: Current Some Day Smoker    Types: Cigars  . Smokeless tobacco: Current User  . Alcohol use Yes     Comment: occasionally     Allergies   Patient has no known allergies.   Review of Systems Review of Systems  Constitutional: Negative for chills and fever.  HENT: Positive for congestion, sore throat and voice change.   Eyes: Negative for itching.  Respiratory: Positive for cough.   Gastrointestinal: Negative for abdominal  pain, nausea and vomiting.  Skin: Negative for rash.  Neurological: Positive for headaches.     Physical Exam Updated Vital Signs BP 134/81 (BP Location: Left Arm)   Pulse 91   Temp 97.9 F (36.6 C) (Oral)   Resp 17   Ht 5\' 5"  (1.651 m)   Wt 170 lb (77.1 kg)   SpO2 99%   BMI 28.29 kg/m   Physical Exam  Constitutional: He is oriented to person, place, and time. He appears well-developed and well-nourished.  HENT:  Head: Normocephalic and atraumatic.  Right Ear: Tympanic membrane and  ear canal normal.  Left Ear: Tympanic membrane and ear canal normal.  Nose: Nose normal.  Mouth/Throat: Uvula is midline, oropharynx is clear and moist and mucous membranes are normal. No posterior oropharyngeal erythema. No tonsillar exudate.  Bilateral tonsillar enlargement. Trachea midline.  Neck: Normal range of motion.  Cardiovascular: Normal rate, regular rhythm and normal heart sounds.   No murmur heard. Pulmonary/Chest: Effort normal and breath sounds normal. No respiratory distress.  Musculoskeletal: Normal range of motion.  Neurological: He is alert and oriented to person, place, and time.  Skin: Skin is warm and dry.  Psychiatric: He has a normal mood and affect. His behavior is normal.  Nursing note and vitals reviewed.    ED Treatments / Results  DIAGNOSTIC STUDIES: Oxygen Saturation is 99% on RA, normal by my interpretation.   COORDINATION OF CARE: 10:51 AM- Will prescribe Flonase and Zyrtec. Pt verbalizes understanding and agrees to plan.  Medications - No data to display  Labs (all labs ordered are listed, but only abnormal results are displayed) Labs Reviewed - No data to display  EKG  EKG Interpretation None       Radiology No results found.  Procedures Procedures (including critical care time)  Medications Ordered in ED Medications - No data to display   Initial Impression / Assessment and Plan / ED Course  I have reviewed the triage vital signs and the nursing notes.  Pertinent labs & imaging results that were available during my care of the patient were reviewed by me and considered in my medical decision making (see chart for details).     Pt symptoms consistent with seasonal allergies. Pt will be discharged with symptomatic treatment.  Discussed return precautions.  Pt is hemodynamically stable & in NAD prior to discharge.   Final Clinical Impressions(s) / ED Diagnoses   Final diagnoses:  Seasonal allergic rhinitis due to pollen     New Prescriptions New Prescriptions   FLUTICASONE (FLONASE) 50 MCG/ACT NASAL SPRAY    Place 1 spray into both nostrils daily.   I personally performed the services described in this documentation, which was scribed in my presence. The recorded information has been reviewed and is accurate.       Fayrene Helperran, Terri Rorrer, PA-C 05/20/16 1112    Rolan BuccoBelfi, Melanie, MD 05/20/16 [phone]

## 2016-05-20 NOTE — ED Triage Notes (Signed)
Pt. Stated, I've had a cold, cough and sometimes a headache after I cough for 3 days.

## 2017-05-09 ENCOUNTER — Emergency Department (HOSPITAL_COMMUNITY)
Admission: EM | Admit: 2017-05-09 | Discharge: 2017-05-10 | Disposition: A | Discharge status: Home / Self Care | Payer: No Typology Code available for payment source | Attending: Emergency Medicine | Admitting: Emergency Medicine

## 2017-05-09 ENCOUNTER — Other Ambulatory Visit: Payer: Self-pay

## 2017-05-09 ENCOUNTER — Emergency Department (HOSPITAL_COMMUNITY): Payer: No Typology Code available for payment source

## 2017-05-09 DIAGNOSIS — Y990 Civilian activity done for income or pay: Secondary | ICD-10-CM | POA: Insufficient documentation

## 2017-05-09 DIAGNOSIS — Y9389 Activity, other specified: Secondary | ICD-10-CM | POA: Insufficient documentation

## 2017-05-09 DIAGNOSIS — Z79899 Other long term (current) drug therapy: Secondary | ICD-10-CM | POA: Diagnosis not present

## 2017-05-09 DIAGNOSIS — X509XXA Other and unspecified overexertion or strenuous movements or postures, initial encounter: Secondary | ICD-10-CM | POA: Insufficient documentation

## 2017-05-09 DIAGNOSIS — F17290 Nicotine dependence, other tobacco product, uncomplicated: Secondary | ICD-10-CM | POA: Diagnosis not present

## 2017-05-09 DIAGNOSIS — S63601A Unspecified sprain of right thumb, initial encounter: Secondary | ICD-10-CM | POA: Insufficient documentation

## 2017-05-09 DIAGNOSIS — S6991XA Unspecified injury of right wrist, hand and finger(s), initial encounter: Secondary | ICD-10-CM | POA: Diagnosis present

## 2017-05-09 DIAGNOSIS — Y9289 Other specified places as the place of occurrence of the external cause: Secondary | ICD-10-CM | POA: Insufficient documentation

## 2017-05-09 NOTE — ED Triage Notes (Signed)
Patient states that he believes that he has dislocated his right thumb pulling down an 18-wheeler door.

## 2017-05-10 MED ORDER — IBUPROFEN 400 MG PO TABS
600.0000 mg | ORAL_TABLET | Freq: Once | ORAL | Status: AC
  Administered 2017-05-10: 600 mg via ORAL
  Filled 2017-05-10: qty 1

## 2017-05-10 NOTE — Discharge Instructions (Signed)
You can take Tylenol or Ibuprofen as directed for pain. You can alternate Tylenol and Ibuprofen every 4 hours. If you take Tylenol at 1pm, then you can take Ibuprofen at 5pm. Then you can take Tylenol again at 9pm.   Wear the splint for support and stabilization.  Follow the RICE (Rest, Ice, Compression, Elevation) protocol as directed.   Follow-up with the referred orthopedic doctor.   Return to the Emergency Dept for any worsening pain, redness/swelling of the hand, numbness/weakness, fever or any other worsening or concerning symptoms.

## 2017-05-10 NOTE — ED Notes (Signed)
Ortho tech returned page 

## 2017-05-10 NOTE — Progress Notes (Signed)
Orthopedic Tech Progress Note Patient Details:  Omar Sheppard [facility] Aug 12, 1995 [id number]  Ortho Devices Type of Ortho Device: Thumb velcro splint Ortho Device/Splint Location: rue Ortho Device/Splint Interventions: Ordered, Application, Adjustment   Post Interventions Patient Tolerated: Well Instructions Provided: Care of device, Adjustment of device   Trinna Post 05/10/2017, 6:36 AM

## 2017-05-10 NOTE — ED Provider Notes (Signed)
[facility] EMERGENCY DEPARTMENT Provider Note   CSN: [id number] Arrival date & time: 05/09/17  2216     History   Chief Complaint Chief Complaint  Patient presents with  . Hand Injury    HPI Omar Sheppard is a 22 y.o. male with PMH/o GERD, ADHD who presents for evaluation of right thumb pain that began last night at approximately 9:30 PM.  Patient reports that he was at work and was lifting a heavy door of an 18 wheeler truck and states that when he pulled it down, because his thumb to bend at an awkward angle.  Patient reports that he felt like it dislocated and then went back in place.  Patient reports that since then he has had some pain, swelling to the right thumb.  Denies any other injury.  Patient states he has not taken anything for the pain.  Patient reports he is able to move all 5 digits without any difficulty.  Patient denies any numbness/weakness.  The history is provided by the patient.    Past Medical History:  Diagnosis Date  . ADHD (attention deficit hyperactivity disorder)   . GERD (gastroesophageal reflux disease)     Patient Active Problem List   Diagnosis Date Noted  . Occult blood in stools 02/26/2014  . Iron deficiency anemia 02/26/2014  . Abdominal pain 02/26/2014  . Abdominal mass, LUQ (left upper quadrant)   . Abdominal mass 02/20/2014  . Syncope 02/20/2014  . Bradycardia 02/20/2014  . Sepsis (HCC) 02/20/2014    Past Surgical History:  Procedure Laterality Date  . ABDOMINAL SURGERY     when infant  . ESOPHAGOGASTRODUODENOSCOPY N/A 02/26/2014   Procedure: ESOPHAGOGASTRODUODENOSCOPY (EGD);  Surgeon: Meryl Dare, MD;  Location: [facility];  Service: Endoscopy;  Laterality: N/A;  . KNEE SURGERY          Home Medications    Prior to Admission medications   Medication Sig Start Date End Date Taking? Authorizing Provider  acetaminophen (TYLENOL) 500 MG tablet Take 500 mg by mouth once.    [provider]  cetirizine (ZYRTEC) 10 MG tablet Take 1 tablet (10 mg total) by mouth daily as needed for allergies. 05/20/16   Fayrene Helper, PA-C  fluticasone (FLONASE) 50 MCG/ACT nasal spray Place 1 spray into both nostrils daily. 05/20/16   Fayrene Helper, PA-C  guaiFENesin-codeine (ROBITUSSIN AC) 100-10 MG/5ML syrup Take 5 mLs by mouth 3 (three) times daily as needed for cough. 03/02/15   Rolland Porter, MD  omeprazole (PRILOSEC) 20 MG capsule Take 1 capsule (20 mg total) by mouth daily. Patient not taking: Reported on 03/02/2015 09/02/14   Gerhard Munch, MD  ondansetron (ZOFRAN ODT) 4 MG disintegrating tablet Take 1 tablet (4 mg total) by mouth every 8 (eight) hours as needed for nausea. 03/02/15   Rolland Porter, MD    Family History No family history on file.  Social History Social History   Tobacco Use  . Smoking status: Current Some Day Smoker    Types: Cigars  . Smokeless tobacco: Current User  Substance Use Topics  . Alcohol use: Yes    Comment: occasionally  . Drug use: Yes    Types: Marijuana    Comment: once a month     Allergies   Patient has no known allergies.   Review of Systems Review of Systems  Musculoskeletal:       Right thumb pain  Neurological: Negative for weakness and numbness.     Physical Exam  Updated Vital Signs BP 123/83 (BP Location: Left Arm)   Pulse (!) 58   Temp 98.4 F (36.9 C) (Oral)   Resp 20   Ht 5' 4.5" (1.638 m)   Wt 72.6 kg (160 lb)   SpO2 96%   BMI 27.04 kg/m   Physical Exam  Constitutional: He appears well-developed and well-nourished.  HENT:  Head: Normocephalic and atraumatic.  Eyes: Conjunctivae and EOM are normal. Right eye exhibits no discharge. Left eye exhibits no discharge. No scleral icterus.  Cardiovascular:  Pulses:      Radial pulses are 2+ on the right side, and 2+ on the left side.  Pulmonary/Chest: Effort normal.  Musculoskeletal:  Tenderness palpation noted to the base of the right thumb with some overlying soft tissue  swelling and mild ecchymosis. Pain elicited on the ulnar aspect of the right thumb when MCP held in isolation.  Flexion/extension of IP intact without any difficulty.  Abduction and abduction of right thumb intact without any difficulty.  No deformity or crepitus noted.  No tenderness palpation noted to other digits of right hand. No snuffbox tenderness.  No abnormalities of the left upper extremity. No abnormalities of the left upper extremity.  Neurological: He is alert.  Sensation intact along major nerve distributions of BUE  Skin: Skin is warm and dry. Capillary refill takes less than 2 seconds.  Good distal cap refill. RUE is not dusky in appearance or cool to touch.  Psychiatric: He has a normal mood and affect. His speech is normal and behavior is normal.  Nursing note and vitals reviewed.    ED Treatments / Results  Labs (all labs ordered are listed, but only abnormal results are displayed) Labs Reviewed - No data to display  EKG None  Radiology Dg Finger Thumb Right  Result Date: 05/09/2017 CLINICAL DATA:  Patient believes he may have dislocated his right thumb at the MCP joint after pulling down an 18 wheeler door. EXAM: RIGHT THUMB 2+V COMPARISON:  None. FINDINGS: There is no evidence of fracture or dislocation. Slight eccentric joint space widening is seen at the MCP joint along the radial aspect. Ligamentous injury is not excluded. IMPRESSION: No fracture nor joint dislocation of the right thumb. Slight eccentric joint space widening along the radial aspect of the first MCP joint is noted that may reflect injury to the radial collateral ligament of the first MCP joint. Electronically Signed   By: Tollie Eth M.D.   On: 05/09/2017 23:19    Procedures Procedures (including critical care time)  Medications Ordered in ED Medications  ibuprofen (ADVIL,MOTRIN) tablet 600 mg (600 mg Oral Given 05/10/17 1191)     Initial Impression / Assessment and Plan / ED Course  I have  reviewed the triage vital signs and the nursing notes.  Pertinent labs & imaging results that were available during my care of the patient were reviewed by me and considered in my medical decision making (see chart for details).     22 year old male who presents for evaluation of right thumb pain that began yesterday.  Reports he was pulling a heavy door and felt like he dislocated went back into place. Patient is afebrile, non-toxic appearing, sitting comfortably on examination table. Vital signs reviewed and stable. Patient is neurovascularly intact. Patient with tenderness to the base of the right thumb and pain to the ulnar aspect when the MCP is held in isolation. Consider fracture vs dislocation vs ligament sprain. XRs ordered at triage.   XR reviewed.  No acute fracture or dislocation.  Slight joint space widening along the radial aspect of the first MCP joint that may reflect injury to the radial collateral ligament of the first MCP. Will treated as thumb sprain. Will plan to place patient in thumb spica.  Discussed results with patient.  Will have patient do light duty at work.  Instructed patient follow-up with hand as directed. Patient had ample opportunity for questions and discussion. All patient's questions were answered with full understanding. Marland Kitchenalldc  Final Clinical Impressions(s) / ED Diagnoses   Final diagnoses:  Sprain of right thumb, unspecified site of finger, initial encounter    ED Discharge Orders    None       Rosana Hoes 05/10/17 1308    Dione Booze, MD 05/10/17 [phone]

## 2019-06-13 ENCOUNTER — Other Ambulatory Visit: Payer: Self-pay

## 2019-06-13 ENCOUNTER — Encounter (HOSPITAL_BASED_OUTPATIENT_CLINIC_OR_DEPARTMENT_OTHER): Payer: Self-pay

## 2019-06-13 ENCOUNTER — Emergency Department (HOSPITAL_BASED_OUTPATIENT_CLINIC_OR_DEPARTMENT_OTHER)
Admission: EM | Admit: 2019-06-13 | Discharge: 2019-06-13 | Disposition: A | Discharge status: Home / Self Care | Payer: Self-pay | Attending: Emergency Medicine | Admitting: Emergency Medicine

## 2019-06-13 ENCOUNTER — Emergency Department (HOSPITAL_BASED_OUTPATIENT_CLINIC_OR_DEPARTMENT_OTHER): Payer: Self-pay

## 2019-06-13 DIAGNOSIS — S61011A Laceration without foreign body of right thumb without damage to nail, initial encounter: Secondary | ICD-10-CM | POA: Insufficient documentation

## 2019-06-13 DIAGNOSIS — W231XXA Caught, crushed, jammed, or pinched between stationary objects, initial encounter: Secondary | ICD-10-CM | POA: Insufficient documentation

## 2019-06-13 DIAGNOSIS — Y9289 Other specified places as the place of occurrence of the external cause: Secondary | ICD-10-CM | POA: Insufficient documentation

## 2019-06-13 DIAGNOSIS — Y9389 Activity, other specified: Secondary | ICD-10-CM | POA: Insufficient documentation

## 2019-06-13 DIAGNOSIS — Y998 Other external cause status: Secondary | ICD-10-CM | POA: Insufficient documentation

## 2019-06-13 DIAGNOSIS — Z87891 Personal history of nicotine dependence: Secondary | ICD-10-CM | POA: Insufficient documentation

## 2019-06-13 MED ORDER — LIDOCAINE HCL (PF) 1 % IJ SOLN
5.0000 mL | Freq: Once | INTRAMUSCULAR | Status: AC
  Administered 2019-06-13: 5 mL via INTRADERMAL
  Filled 2019-06-13: qty 5

## 2019-06-13 NOTE — ED Triage Notes (Signed)
Paper towel removed from wound and sterile guaze placed in triage with pressure Coban dressing.

## 2019-06-13 NOTE — ED Provider Notes (Signed)
[facility] EMERGENCY DEPARTMENT Provider Note   CSN: [id number] Arrival date & time: 06/13/19  1457     History Chief Complaint  Patient presents with  . Hand Injury    Omar Sheppard is a 24 y.o. male.  Patient presents the emergency department today after closing his right thumb in a door about 2 hours prior to arrival.  Tetanus is up-to-date.  He sustained a laceration over the thumb.  No treatments prior to arrival.  Patient complains of throbbing pain which radiates up into his hand.  Onset of symptoms acute.  Course is constant.        Past Medical History:  Diagnosis Date  . ADHD (attention deficit hyperactivity disorder)   . GERD (gastroesophageal reflux disease)     Patient Active Problem List   Diagnosis Date Noted  . Occult blood in stools 02/26/2014  . Iron deficiency anemia 02/26/2014  . Abdominal pain 02/26/2014  . Abdominal mass, LUQ (left upper quadrant)   . Abdominal mass 02/20/2014  . Syncope 02/20/2014  . Bradycardia 02/20/2014  . Sepsis (HCC) 02/20/2014    Past Surgical History:  Procedure Laterality Date  . ABDOMINAL SURGERY     when infant  . ESOPHAGOGASTRODUODENOSCOPY N/A 02/26/2014   Procedure: ESOPHAGOGASTRODUODENOSCOPY (EGD);  Surgeon: Meryl Dare, MD;  Location: [facility];  Service: Endoscopy;  Laterality: N/A;  . KNEE SURGERY         No family history on file.  Social History   Tobacco Use  . Smoking status: Former Smoker    Types: Cigars  . Smokeless tobacco: Former Clinical biochemist  . Vaping Use: Never used  Substance Use Topics  . Alcohol use: Not Currently    Comment: occasionally  . Drug use: Yes    Types: Marijuana    Comment: once a month    Home Medications Prior to Admission medications   Medication Sig Start Date End Date Taking? Authorizing Provider  acetaminophen (TYLENOL) 500 MG tablet Take 500 mg by mouth once.    [provider]  cetirizine (ZYRTEC) 10 MG tablet Take  1 tablet (10 mg total) by mouth daily as needed for allergies. 05/20/16   Fayrene Helper, PA-C  fluticasone (FLONASE) 50 MCG/ACT nasal spray Place 1 spray into both nostrils daily. 05/20/16   Fayrene Helper, PA-C  guaiFENesin-codeine (ROBITUSSIN AC) 100-10 MG/5ML syrup Take 5 mLs by mouth 3 (three) times daily as needed for cough. 03/02/15   Rolland Porter, MD  omeprazole (PRILOSEC) 20 MG capsule Take 1 capsule (20 mg total) by mouth daily. Patient not taking: Reported on 03/02/2015 09/02/14   Gerhard Munch, MD  ondansetron (ZOFRAN ODT) 4 MG disintegrating tablet Take 1 tablet (4 mg total) by mouth every 8 (eight) hours as needed for nausea. 03/02/15   Rolland Porter, MD    Allergies    Patient has no known allergies.  Review of Systems   Review of Systems  Constitutional: Negative for activity change.  Musculoskeletal: Positive for arthralgias and myalgias. Negative for back pain, gait problem, joint swelling and neck pain.  Skin: Positive for wound.  Neurological: Negative for weakness and numbness.    Physical Exam Updated Vital Signs BP 122/80 (BP Location: Left Arm)   Pulse 94   Temp 98.2 F (36.8 C) (Oral)   Resp 20   Ht 5\' 5"  (1.651 m)   Wt 61.2 kg   SpO2 100%   BMI 22.47 kg/m   Physical Exam Vitals and nursing note  reviewed.  Constitutional:      Appearance: He is well-developed.  HENT:     Head: Normocephalic and atraumatic.  Eyes:     Conjunctiva/sclera: Conjunctivae normal.  Cardiovascular:     Pulses: Normal pulses. No decreased pulses.  Musculoskeletal:        General: Tenderness present.     Right hand: Tenderness present.     Cervical back: Normal range of motion and neck supple.     Comments: 1cm laceration, hemostatic, nongaping over the fat pad of the thumb, distal to the IP joint.  Wound base explored and is clean.  No foreign bodies visualized.  Skin:    General: Skin is warm and dry.  Neurological:     Mental Status: He is alert.     Sensory: No sensory  deficit.     Comments: Motor, sensation, and vascular distal to the injury is fully intact.      ED Results / Procedures / Treatments   Labs (all labs ordered are listed, but only abnormal results are displayed) Labs Reviewed - No data to display  EKG None  Radiology DG Finger Thumb Right  Result Date: 06/13/2019 CLINICAL DATA:  Right thumb pain after trauma EXAM: RIGHT THUMB 2+V COMPARISON:  05/09/2017 FINDINGS: Faint triangular density along the ulnar aspect of the right thumb interphalangeal joint seen only on oblique view. This finding is partially obscured by overlying bandaging material. No donor site. Osseous structures appear otherwise intact. No dislocation. Soft tissue swelling. IMPRESSION: Faint triangular density along the ulnar aspect of the right thumb interphalangeal joint seen only on oblique view, possibly representing a fracture fragment. No donor site is identified. Consider dedicated right thumb radiographs after removal of overlying bandaging for further evaluation. Electronically Signed   By: Duanne Guess D.O.   On: 06/13/2019 17:04    Procedures .Marland KitchenLaceration Repair  Date/Time: 06/13/2019 6:00 PM Performed by: Renne Crigler, PA-C Authorized by: Renne Crigler, PA-C   Consent:    Consent obtained:  Verbal   Consent given by:  Patient   Risks discussed:  Infection, pain and nerve damage   Alternatives discussed:  No treatment (repair with dermabond) Anesthesia (see MAR for exact dosages):    Anesthesia method:  Nerve block   Block location:  R thumb   Block needle gauge:  27 G   Block anesthetic:  Lidocaine 1% w/o epi   Block technique:  3-sided ring   Block injection procedure:  Anatomic landmarks identified, incremental injection, introduced needle, anatomic landmarks palpated and negative aspiration for blood   Block outcome:  Incomplete block (additional 1cc into wound) Laceration details:    Location:  Finger   Finger location:  R thumb   Length  (cm):  1 Repair type:    Repair type:  Simple Pre-procedure details:    Preparation:  Patient was prepped and draped in usual sterile fashion and imaging obtained to evaluate for foreign bodies Exploration:    Hemostasis achieved with:  Direct pressure   Wound exploration: wound explored through full range of motion and entire depth of wound probed and visualized     Contaminated: no   Treatment:    Area cleansed with:  Saline   Amount of cleaning:  Extensive   Irrigation volume:  1000cc   Irrigation method:  Pressure wash   Visualized foreign bodies/material removed: no   Skin repair:    Repair method:  Sutures   Suture size:  5-0   Suture material:  Nylon  Suture technique:  Simple interrupted   Number of sutures:  5 Approximation:    Approximation:  Close Post-procedure details:    Dressing:  Open (no dressing)   Patient tolerance of procedure:  Tolerated well, no immediate complications   (including critical care time)  Medications Ordered in ED Medications  lidocaine (PF) (XYLOCAINE) 1 % injection 5 mL (5 mLs Intradermal Given 06/13/19 1629)    ED Course  I have reviewed the triage vital signs and the nursing notes.  Pertinent labs & imaging results that were available during my care of the patient were reviewed by me and considered in my medical decision making (see chart for details).  Patient seen and examined.  X-ray reviewed personally.  Discussed wound repair with patient.  He agrees to digital block, irrigation, and wound closure.  Vital signs reviewed and are as follows: BP 122/80 (BP Location: Left Arm)   Pulse 94   Temp 98.2 F (36.8 C) (Oral)   Resp 20   Ht 5\' 5"  (1.651 m)   Wt 61.2 kg   SpO2 100%   BMI 22.47 kg/m   Later in the visit, patient asked about using Dermabond.  I discussed that this would likely not be the best modality given location and the fact that he uses his hands for work.  He agrees to proceed with suturing.  We also discussed  the small area on the x-ray which may be a fracture.  Patient is not point tender over this area and he has good range of motion of the IP joint.  Overall, low clinical concern for fracture.  I offered repeat x-ray after wound repair.  He declines.  6:03 PM Wound repaired as above. Patient counseled on wound care. Patient counseled on need to return or see PCP/urgent care for suture removal in 10-14 days. Patient was urged to return to the Emergency Department urgently with worsening pain, swelling, expanding erythema especially if it streaks away from the affected area, fever, or if they have any other concerns. Patient verbalized understanding.      MDM Rules/Calculators/A&P                          Patient with thumb laceration, low concern for underlying fracture.  Wound repaired without complication.  Tetanus up-to-date.  No indication for prophylactic antibiotics.   Final Clinical Impression(s) / ED Diagnoses Final diagnoses:  Laceration of right thumb without foreign body without damage to nail, initial encounter    Rx / DC Orders ED Discharge Orders    None       Carlisle Cater, PA-C 06/13/19 [place], DO 06/13/19 1813

## 2019-06-13 NOTE — Discharge Instructions (Signed)
Please read and follow all provided instructions.  Your diagnoses today include:  1. Laceration of right thumb without foreign body without damage to nail, initial encounter     Tests performed today include:  X-ray of the affected area that did not show any foreign bodies or definite broken bones. There was a small area that we cannot rule out a small fracture, but this area may also just be from the bandage over the finger  Vital signs. See below for your results today.   Medications prescribed:  Please use over-the-counter NSAID medications (ibuprofen, naproxen) as directed on the packaging for pain.   Take any prescribed medications only as directed.   Home care instructions:  Follow any educational materials and wound care instructions contained in this packet.   Keep affected area above the level of your heart when possible to minimize swelling. Wash area gently twice a day with warm soapy water. Do not apply alcohol or hydrogen peroxide. Cover the area if it draining or weeping.   Follow-up instructions: Suture Removal: Return to the Emergency Department or see your primary care care doctor in 10 days for a recheck of your wound and removal of your sutures or staples.    Return instructions:  Return to the Emergency Department if you have:  Fever  Worsening pain  Worsening swelling of the wound  Pus draining from the wound  Redness of the skin that moves away from the wound, especially if it streaks away from the affected area   Any other emergent concerns  Your vital signs today were: BP 122/80 (BP Location: Left Arm)   Pulse 94   Temp 98.2 F (36.8 C) (Oral)   Resp 20   Ht 5\' 5"  (1.651 m)   Wt 61.2 kg   SpO2 100%   BMI 22.47 kg/m  If your blood pressure (BP) was elevated above 135/85 this visit, please have this repeated by your doctor within one month. --------------

## 2019-06-13 NOTE — ED Notes (Signed)
Right thumb shut in back door of a van 1 hr pta

## 2019-06-13 NOTE — ED Triage Notes (Signed)
Pt slammed his R thumb into a van door about 1 hour PTA.

## 2019-06-17 ENCOUNTER — Other Ambulatory Visit: Payer: Self-pay

## 2019-06-17 ENCOUNTER — Emergency Department (HOSPITAL_BASED_OUTPATIENT_CLINIC_OR_DEPARTMENT_OTHER)
Admission: EM | Admit: 2019-06-17 | Discharge: 2019-06-17 | Disposition: A | Discharge status: Home / Self Care | Payer: Self-pay | Attending: Emergency Medicine | Admitting: Emergency Medicine

## 2019-06-17 ENCOUNTER — Encounter (HOSPITAL_BASED_OUTPATIENT_CLINIC_OR_DEPARTMENT_OTHER): Payer: Self-pay | Admitting: Emergency Medicine

## 2019-06-17 DIAGNOSIS — T7840XA Allergy, unspecified, initial encounter: Secondary | ICD-10-CM | POA: Insufficient documentation

## 2019-06-17 DIAGNOSIS — F17290 Nicotine dependence, other tobacco product, uncomplicated: Secondary | ICD-10-CM | POA: Insufficient documentation

## 2019-06-17 DIAGNOSIS — T63441A Toxic effect of venom of bees, accidental (unintentional), initial encounter: Secondary | ICD-10-CM

## 2019-06-17 MED ORDER — DIPHENHYDRAMINE HCL 50 MG/ML IJ SOLN
25.0000 mg | Freq: Once | INTRAMUSCULAR | Status: AC
  Administered 2019-06-17: 25 mg via INTRAVENOUS
  Filled 2019-06-17: qty 1

## 2019-06-17 MED ORDER — FAMOTIDINE IN NACL 20-0.9 MG/50ML-% IV SOLN
20.0000 mg | Freq: Once | INTRAVENOUS | Status: AC
  Administered 2019-06-17: 20 mg via INTRAVENOUS
  Filled 2019-06-17: qty 50

## 2019-06-17 MED ORDER — EPINEPHRINE 0.3 MG/0.3ML IJ SOAJ: 0.3000 mg | INTRAMUSCULAR | 1 refills | Status: DC | PRN

## 2019-06-17 MED ORDER — METHYLPREDNISOLONE SODIUM SUCC 125 MG IJ SOLR
125.0000 mg | Freq: Once | INTRAMUSCULAR | Status: AC
  Administered 2019-06-17: 125 mg via INTRAVENOUS
  Filled 2019-06-17: qty 2

## 2019-06-17 MED ORDER — EPINEPHRINE 0.3 MG/0.3ML IJ SOAJ
0.3000 mg | Freq: Once | INTRAMUSCULAR | Status: AC
  Administered 2019-06-17: 0.3 mg via INTRAMUSCULAR
  Filled 2019-06-17: qty 0.3

## 2019-06-17 MED FILL — EPINEPHRINE 0.3 MG AUTO-INJ: 0.3 | 2 days supply | Qty: 2 | Fill #0

## 2019-06-17 NOTE — ED Triage Notes (Signed)
Pt cutting tree and stung on both ankles by bees.  Sts he has never been allergic to bees before.  Pt weak, diaphoretic.

## 2019-06-17 NOTE — Discharge Instructions (Addendum)
It was our pleasure to provide your ER care today - we hope that you feel better.  Take benadryl 25 mg every 6 hours for the next day.  In event of future severe allergic reaction (I.e. throat closing, unable to breath, about the faint) - use epipen as directed, take benadryl, and seek medical attention immediately.   Return to ER if worse, new symptoms, trouble breathing, or other concern.

## 2019-06-17 NOTE — ED Notes (Signed)
Offered food and soft drink to patient, pt tolerating well. Alert and oriented x 4.

## 2019-06-17 NOTE — ED Provider Notes (Addendum)
[facility] EMERGENCY DEPARTMENT Provider Note   CSN: [id number] Arrival date & time: 06/17/19  1144     History Chief Complaint  Patient presents with  . Allergic Reaction    Omar Sheppard is a 24 y.o. male.  Patient c/o bee sting to bilateral ankle area just prior to arrival today. C/o feeling generally, weak, faint, and sweaty. Symptoms acute onset, mod-severe, constant, persistent. Denies hx allergic reaction to bee stings. No hives or generalized rash. No trouble breathing or swallowing. State felt fine immediately prior to bee stings.   The history is provided by the patient.  Allergic Reaction Presenting symptoms: no difficulty swallowing and no rash        Past Medical History:  Diagnosis Date  . ADHD (attention deficit hyperactivity disorder)   . GERD (gastroesophageal reflux disease)     Patient Active Problem List   Diagnosis Date Noted  . Occult blood in stools 02/26/2014  . Iron deficiency anemia 02/26/2014  . Abdominal pain 02/26/2014  . Abdominal mass, LUQ (left upper quadrant)   . Abdominal mass 02/20/2014  . Syncope 02/20/2014  . Bradycardia 02/20/2014  . Sepsis ([place]) 02/20/2014    Past Surgical History:  Procedure Laterality Date  . ABDOMINAL SURGERY     when infant  . ESOPHAGOGASTRODUODENOSCOPY N/A 02/26/2014   Procedure: ESOPHAGOGASTRODUODENOSCOPY (EGD);  Surgeon: Ladene Artist, MD;  Location: [facility];  Service: Endoscopy;  Laterality: N/A;  . KNEE SURGERY         No family history on file.  Social History   Tobacco Use  . Smoking status: Former Smoker    Types: Cigars  . Smokeless tobacco: Former Network engineer  . Vaping Use: Never used  Substance Use Topics  . Alcohol use: Not Currently    Comment: occasionally  . Drug use: Yes    Types: Marijuana    Comment: once a month    Home Medications Prior to Admission medications   Medication Sig Start Date End Date Taking? Authorizing Provider    acetaminophen (TYLENOL) 500 MG tablet Take 500 mg by mouth once.    [provider]  cetirizine (ZYRTEC) 10 MG tablet Take 1 tablet (10 mg total) by mouth daily as needed for allergies. 05/20/16   Domenic Moras, PA-C  fluticasone (FLONASE) 50 MCG/ACT nasal spray Place 1 spray into both nostrils daily. 05/20/16   Domenic Moras, PA-C  guaiFENesin-codeine (ROBITUSSIN AC) 100-10 MG/5ML syrup Take 5 mLs by mouth 3 (three) times daily as needed for cough. 03/02/15   Tanna Furry, MD  ondansetron (ZOFRAN ODT) 4 MG disintegrating tablet Take 1 tablet (4 mg total) by mouth every 8 (eight) hours as needed for nausea. 03/02/15   Tanna Furry, MD  omeprazole (PRILOSEC) 20 MG capsule Take 1 capsule (20 mg total) by mouth daily. Patient not taking: Reported on 03/02/2015 09/02/14 06/13/19  Carmin Muskrat, MD    Allergies    Patient has no known allergies.  Review of Systems   Review of Systems  Constitutional: Negative for fever.  HENT: Negative for sore throat and trouble swallowing.   Eyes: Negative for redness.  Respiratory: Negative for shortness of breath.   Cardiovascular: Negative for chest pain.  Gastrointestinal: Negative for abdominal pain, diarrhea and vomiting.  Genitourinary: Negative for flank pain.  Musculoskeletal: Negative for back pain.  Skin: Negative for rash.  Neurological: Negative for headaches.  Hematological: Does not bruise/bleed easily.  Psychiatric/Behavioral: Negative for confusion.    Physical Exam Updated  Vital Signs BP (!) 113/59 (BP Location: Right Arm)   Pulse 78   Temp (!) 97.5 F (36.4 C) (Axillary)   Resp (!) 77   Ht 1.651 m (5\' 5" )   Wt 60.1 kg   SpO2 100%   BMI 22.03 kg/m   Physical Exam Vitals and nursing note reviewed.  Constitutional:      Appearance: Normal appearance. He is well-developed.  HENT:     Head: Atraumatic.     Nose: Nose normal.     Mouth/Throat:     Mouth: Mucous membranes are moist.     Pharynx: Oropharynx is clear.  Eyes:      General: No scleral icterus.    Conjunctiva/sclera: Conjunctivae normal.     Pupils: Pupils are equal, round, and reactive to light.  Neck:     Trachea: No tracheal deviation.  Cardiovascular:     Rate and Rhythm: Normal rate and regular rhythm.     Pulses: Normal pulses.     Heart sounds: Normal heart sounds. No murmur heard.  No friction rub. No gallop.   Pulmonary:     Effort: Pulmonary effort is normal. No accessory muscle usage or respiratory distress.     Breath sounds: Normal breath sounds.  Abdominal:     General: Bowel sounds are normal. There is no distension.     Palpations: Abdomen is soft.     Tenderness: There is no abdominal tenderness. There is no guarding.  Genitourinary:    Comments: No cva tenderness. Musculoskeletal:        General: No swelling.     Cervical back: Normal range of motion and neck supple. No rigidity.  Skin:    General: Skin is warm and dry.     Findings: No rash.     Comments: One area to each ankle felt c/w bee sting. No fb/stinger present.   Neurological:     Mental Status: He is alert.     Comments: Alert, speech clear.   Psychiatric:     Comments: Pt slow to respond, odd affect.      ED Results / Procedures / Treatments   Labs (all labs ordered are listed, but only abnormal results are displayed) Labs Reviewed - No data to display  EKG EKG Interpretation  Date/Time:  Tuesday June 17 2019 11:55:02 EDT Ventricular Rate:  64 PR Interval:    QRS Duration: 116 QT Interval:  455 QTC Calculation: 470 R Axis:   76 Text Interpretation: Sinus rhythm Nonspecific T wave abnormality Confirmed by 11-05-2001 (Cathren Laine) on 06/17/2019 11:57:46 AM   Radiology No results found.  Procedures Procedures (including critical care time)  Medications Ordered in ED Medications  EPINEPHrine (EPI-PEN) injection 0.3 mg (has no administration in time range)  methylPREDNISolone sodium succinate (SOLU-MEDROL) 125 mg/2 mL injection 125 mg (has no  administration in time range)  diphenhydrAMINE (BENADRYL) injection 25 mg (has no administration in time range)  famotidine (PEPCID) IVPB 20 mg premix (has no administration in time range)    ED Course  I have reviewed the triage vital signs and the nursing notes.  Pertinent labs & imaging results that were available during my care of the patient were reviewed by me and considered in my medical decision making (see chart for details).    MDM Rules/Calculators/A&P                          Iv ns. Continuous pulse ox and monitor.  Reviewed nursing notes and prior charts for additional history.   BP is soft, (99/56), and patient reports feeling faint, weak, sweaty - will give epi, benadryl, solumedrol and pepcid.   Recheck pt, feeling improved. No sweats, no faintness. bp is improved from prior. Chest cta. No wheezing.  As received EPI, will observe in ED for additional period.   Po fluids, food.   Recheck pt - indicates feels fine, symptoms remain resolved.   rx epipen for home (in event future severe allergic reaction).       Final Clinical Impression(s) / ED Diagnoses Final diagnoses:  None    Rx / DC Orders ED Discharge Orders    None       Cathren Laine, MD 06/17/19 1441    Cathren Laine, MD 06/17/19 1501

## 2021-04-09 IMAGING — CR DG FINGER THUMB 2+V*R*
3 series · 3 of 3 positions shown · non-contrast
Comparison: 05/09/2017

CLINICAL DATA: Right thumb pain after trauma

EXAM:
RIGHT THUMB 2+V

[x finger obl. right]
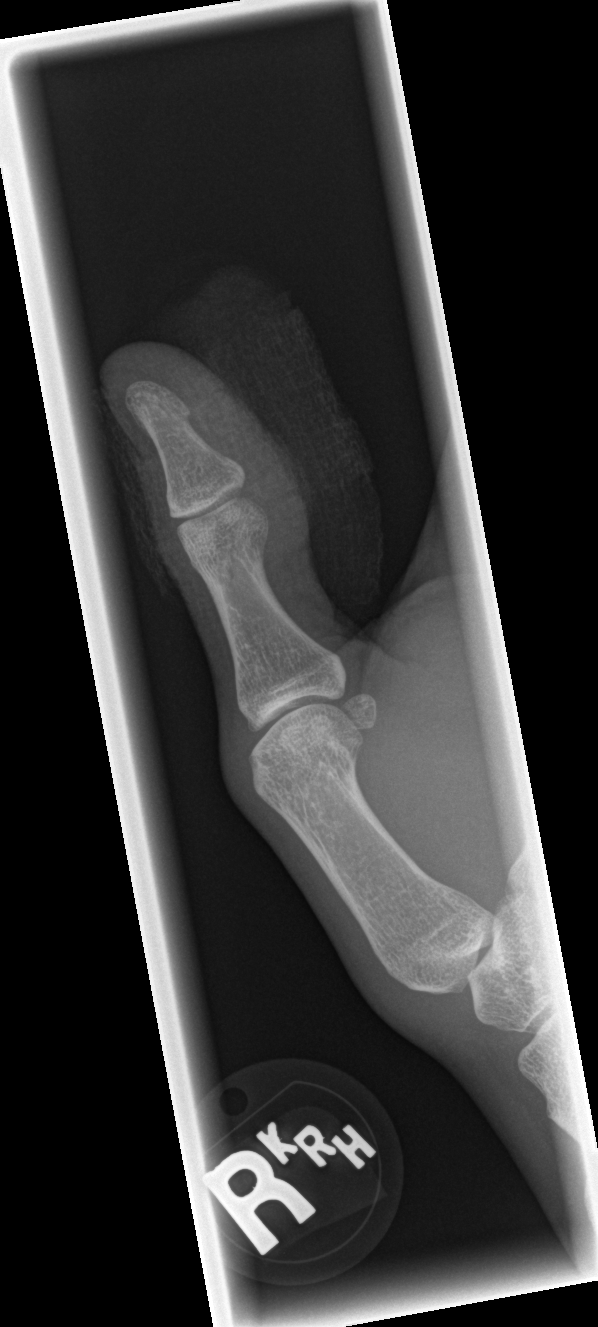

[x finger lateral right]
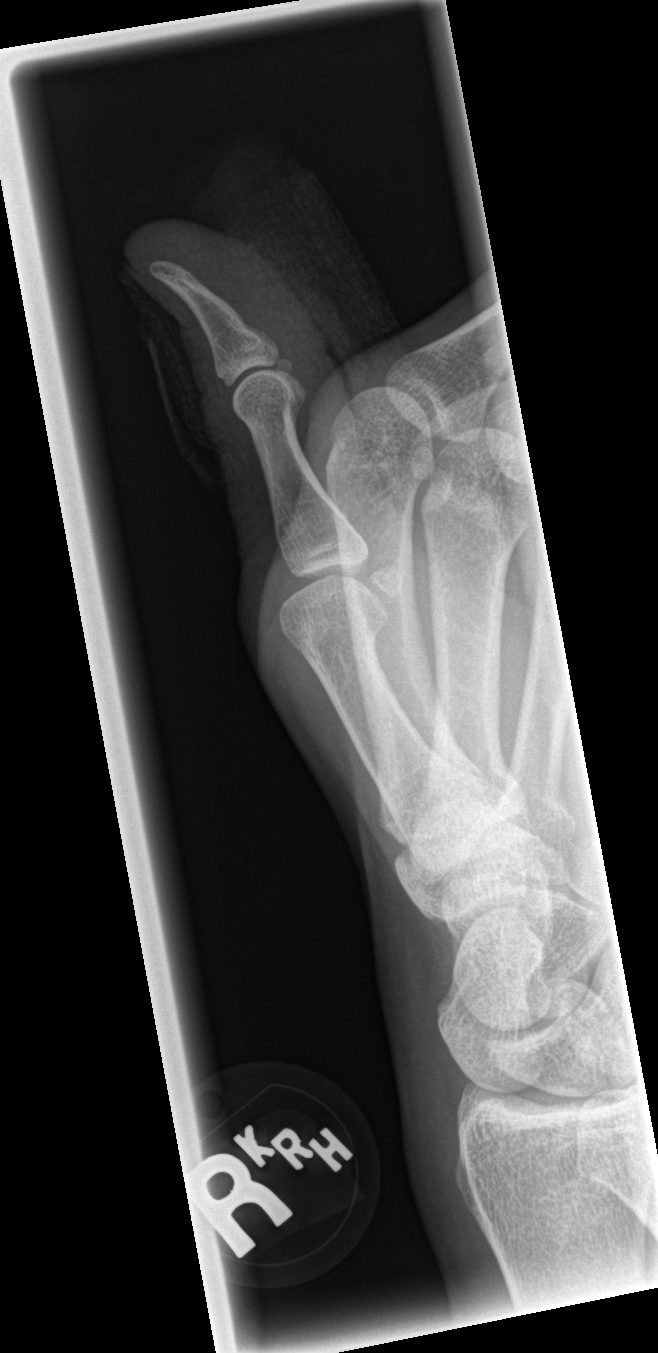

[x finger pa right]
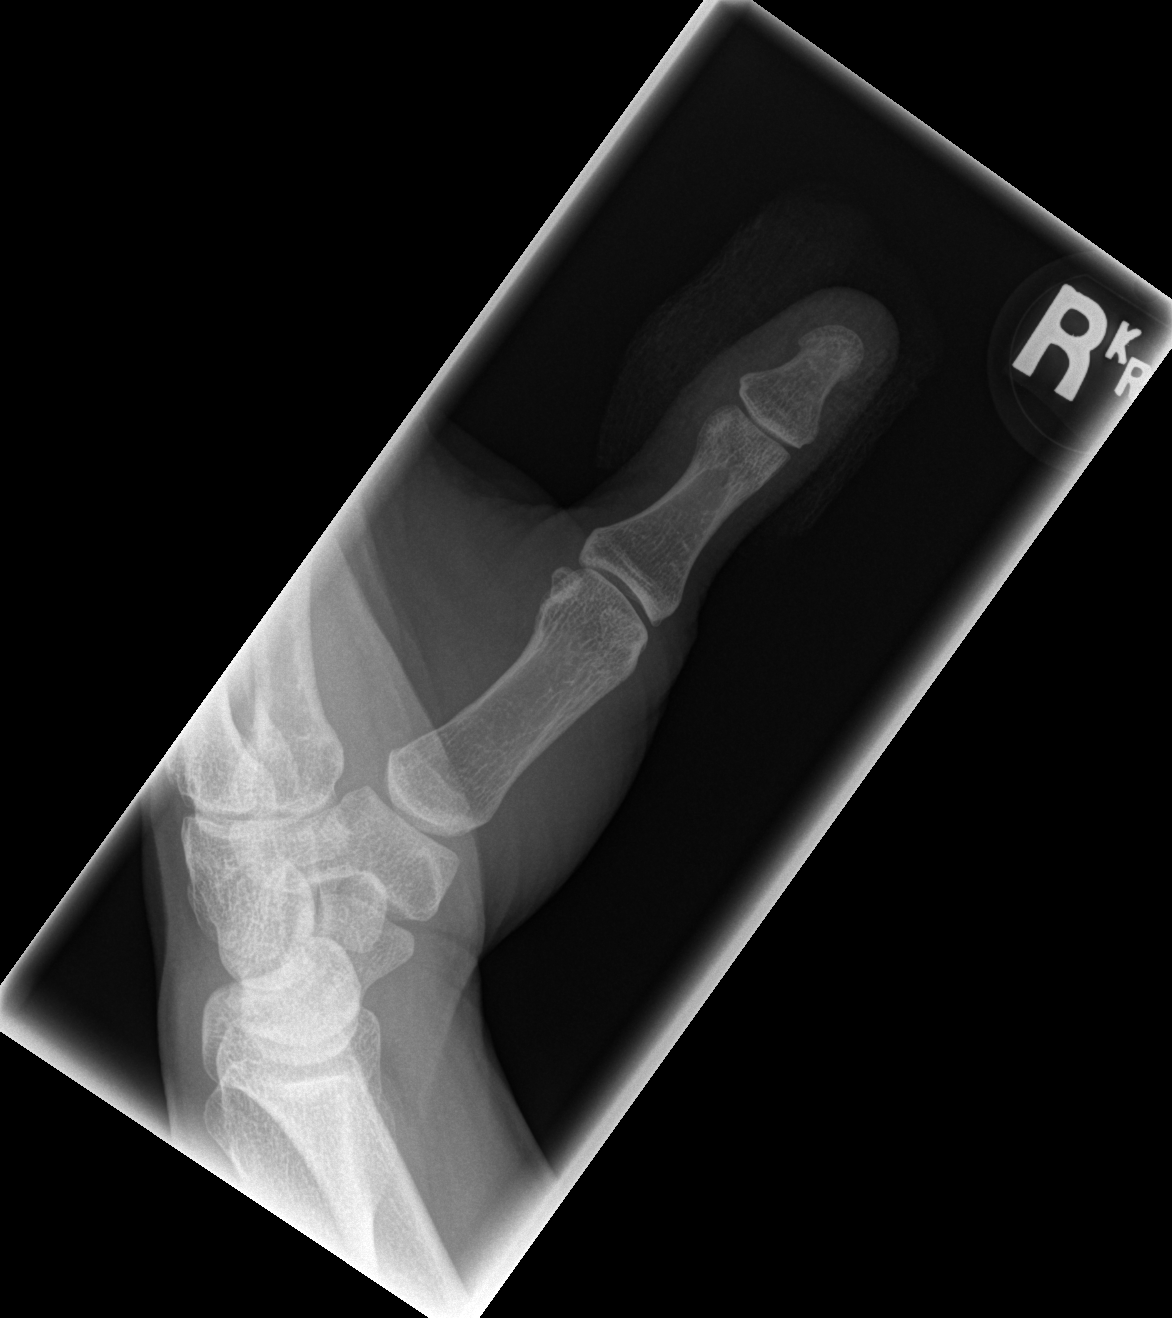

[3 of 3 positions shown; findings below may reference images not displayed]

FINDINGS: Faint triangular density along the ulnar aspect of the right thumb
interphalangeal joint seen only on oblique view. This finding is
partially obscured by overlying bandaging material. No donor site.
Osseous structures appear otherwise intact. No dislocation. Soft
tissue swelling.
IMPRESSION: Faint triangular density along the ulnar aspect of the right thumb
interphalangeal joint seen only on oblique view, possibly
representing a fracture fragment. No donor site is identified.
Consider dedicated right thumb radiographs after removal of
overlying bandaging for further evaluation.

## 2021-06-17 ENCOUNTER — Emergency Department (HOSPITAL_COMMUNITY)
Admission: EM | Admit: 2021-06-17 | Discharge: 2021-06-17 | Disposition: A | Discharge status: Home / Self Care | Payer: Self-pay | Attending: Emergency Medicine | Admitting: Emergency Medicine

## 2021-06-17 ENCOUNTER — Other Ambulatory Visit: Payer: Self-pay

## 2021-06-17 ENCOUNTER — Encounter (HOSPITAL_COMMUNITY): Payer: Self-pay | Admitting: Emergency Medicine

## 2021-06-17 DIAGNOSIS — T782XXA Anaphylactic shock, unspecified, initial encounter: Secondary | ICD-10-CM | POA: Insufficient documentation

## 2021-06-17 DIAGNOSIS — W57XXXA Bitten or stung by nonvenomous insect and other nonvenomous arthropods, initial encounter: Secondary | ICD-10-CM | POA: Insufficient documentation

## 2021-06-17 LAB — BASIC METABOLIC PANEL
Anion gap: 6 (ref 5–15)
BUN: 10 mg/dL (ref 6–20)
CO2: 26 mmol/L (ref 22–32)
Calcium: 9.3 mg/dL (ref 8.9–10.3)
Chloride: 107 mmol/L (ref 98–111)
Creatinine, Ser: 0.78 mg/dL (ref 0.61–1.24)
GFR, Estimated: >60 mL/min (ref >60)
Glucose, Bld: 108 mg/dL — ABNORMAL HIGH (ref 70–99)
Potassium: 4 mmol/L (ref 3.5–5.1)
Sodium: 139 mmol/L (ref 135–145)

## 2021-06-17 LAB — CBC WITH DIFFERENTIAL/PLATELET
Abs Immature Granulocytes: 0.01 10*3/uL (ref 0.00–0.07)
Basophils Absolute: 0 10*3/uL (ref 0.0–0.1)
Basophils Relative: 0 %
Eosinophils Absolute: 0.1 10*3/uL (ref 0.0–0.5)
Eosinophils Relative: 1 %
HCT: 44.5 % (ref 39.0–52.0)
Hemoglobin: 15.1 g/dL (ref 13.0–17.0)
Immature Granulocytes: 0 %
Lymphocytes Relative: 60 %
Lymphs Abs: 4.9 10*3/uL — ABNORMAL HIGH (ref 0.7–4.0)
MCH: 30.3 pg (ref 26.0–34.0)
MCHC: 33.9 g/dL (ref 30.0–36.0)
MCV: 89.2 fL (ref 80.0–100.0)
Monocytes Absolute: 0.6 10*3/uL (ref 0.1–1.0)
Monocytes Relative: 7 %
Neutro Abs: 2.7 10*3/uL (ref 1.7–7.7)
Neutrophils Relative %: 32 %
Platelets: 307 10*3/uL (ref 150–400)
RBC: 4.99 MIL/uL (ref 4.22–5.81)
RDW: 13.1 % (ref 11.5–15.5)
WBC: 8.2 10*3/uL (ref 4.0–10.5)
nRBC: 0 % (ref 0.0–0.2)

## 2021-06-17 MED ORDER — EPINEPHRINE 0.3 MG/0.3ML IJ SOAJ: 0.3000 mg | Freq: Once | INTRAMUSCULAR | Status: AC

## 2021-06-17 MED ORDER — FEXOFENADINE HCL 60 MG PO TABS: 60.0000 mg | ORAL_TABLET | Freq: Two times a day (BID) | ORAL | 0 refills | Status: AC

## 2021-06-17 MED ORDER — METHYLPREDNISOLONE SODIUM SUCC 125 MG IJ SOLR
125.0000 mg | Freq: Once | INTRAMUSCULAR | Status: AC
  Administered 2021-06-17: 125 mg via INTRAVENOUS
  Filled 2021-06-17: qty 2

## 2021-06-17 MED ORDER — SODIUM CHLORIDE 0.9 % IV BOLUS
1000.0000 mL | Freq: Once | INTRAVENOUS | Status: AC
  Administered 2021-06-17: 1000 mL via INTRAVENOUS

## 2021-06-17 MED ORDER — EPINEPHRINE 0.3 MG/0.3ML IJ SOAJ
INTRAMUSCULAR | Status: AC
  Administered 2021-06-17: 0.3 mg via INTRAMUSCULAR
  Filled 2021-06-17: qty 0.3

## 2021-06-17 MED ORDER — DIPHENHYDRAMINE HCL 50 MG/ML IJ SOLN
50.0000 mg | Freq: Once | INTRAMUSCULAR | Status: AC
Start: 2021-06-17 — End: 2021-06-17
  Administered 2021-06-17: 50 mg via INTRAVENOUS
  Filled 2021-06-17: qty 1

## 2021-06-17 MED ORDER — FAMOTIDINE IN NACL 20-0.9 MG/50ML-% IV SOLN
20.0000 mg | Freq: Once | INTRAVENOUS | Status: AC
  Administered 2021-06-17: 20 mg via INTRAVENOUS
  Filled 2021-06-17: qty 50

## 2021-06-17 MED ORDER — PREDNISONE 20 MG PO TABS: 20.0000 mg | ORAL_TABLET | Freq: Two times a day (BID) | ORAL | 0 refills | Status: AC

## 2021-06-17 MED ORDER — EPINEPHRINE 0.3 MG/0.3ML IJ SOAJ: 0.3000 mg | INTRAMUSCULAR | 0 refills | Status: AC | PRN

## 2021-06-17 NOTE — ED Triage Notes (Signed)
Pt to ER with c/o bee sting.  States he has a history of allergic reaction. Pt is diaphoretic, reports feels like his throat is closing and is light headed.  Pt states this happened about 20 mins PTA.

## 2021-06-17 NOTE — ED Provider Notes (Signed)
[facility] EMERGENCY DEPARTMENT Provider Note   CSN: [id number] Arrival date & time: 06/17/21  1150     History  Chief Complaint  Patient presents with   Insect Bite    Omar Sheppard is a 25 y.o. male.  Patient was chief complaint of swelling throat tightening lightheadedness.  Symptoms ongoing for about 20 minutes.  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  Denies any recent illnesses no fever no cough no vomiting or diarrhea.  Bystander tried to give him an EpiPen injection but it was done incorrectly and he did not receive any medication.       Home Medications Prior to Admission medications   Medication Sig Start Date End Date Taking? Authorizing Provider  EPINEPHrine 0.3 mg/0.3 mL IJ SOAJ injection Inject 0.3 mg into the muscle as needed for anaphylaxis. 06/17/21  Yes Jakeem Grape, Eustace Moore, MD  fexofenadine (ALLEGRA) 60 MG tablet Take 1 tablet (60 mg total) by mouth 2 (two) times daily for 5 days. 06/17/21 06/22/21 Yes Anette Barra, Eustace Moore, MD  guaiFENesin-codeine [facility]) 100-10 MG/5ML syrup Take 5 mLs by mouth 3 (three) times daily as needed for cough. 03/02/15  Yes Rolland Porter, MD  predniSONE (DELTASONE) 20 MG tablet Take 1 tablet (20 mg total) by mouth 2 (two) times daily with a meal for 5 days. 06/17/21 06/22/21 Yes Cheryll Cockayne, MD  omeprazole (PRILOSEC) 20 MG capsule Take 1 capsule (20 mg total) by mouth daily. Patient not taking: Reported on 03/02/2015 09/02/14 06/13/19  Gerhard Munch, MD      Allergies    Bee venom    Review of Systems   Review of Systems  Constitutional:  Negative for fever.  HENT:  Negative for ear pain and sore throat.   Eyes:  Negative for pain.  Respiratory:  Negative for cough.   Cardiovascular:  Negative for chest pain.  Gastrointestinal:  Negative for abdominal pain.  Genitourinary:  Negative for flank pain.  Musculoskeletal:  Negative for back pain.  Skin:  Negative for  color change and rash.  Neurological:  Negative for syncope.  All other systems reviewed and are negative.   Physical Exam Updated Vital Signs BP 135/79   Pulse 81   Temp 97.7 F (36.5 C) (Oral)   Resp 19   Ht 5\' 5"  (1.651 m)   Wt 70.3 kg   SpO2 100%   BMI 25.79 kg/m  Physical Exam Constitutional:      Appearance: He is well-developed.  HENT:     Head: Normocephalic.     Nose: Nose normal.  Eyes:     Extraocular Movements: Extraocular movements intact.  Cardiovascular:     Rate and Rhythm: Normal rate.  Pulmonary:     Effort: Pulmonary effort is normal.  Skin:    Coloration: Skin is not jaundiced.  Neurological:     General: No focal deficit present.     Mental Status: He is alert and oriented to person, place, and time. Mental status is at baseline.     ED Results / Procedures / Treatments   Labs (all labs ordered are listed, but only abnormal results are displayed) Labs Reviewed  CBC WITH DIFFERENTIAL/PLATELET - Abnormal; Notable for the following components:      Result Value   Lymphs Abs 4.9 (*)    All other components within normal limits  BASIC METABOLIC PANEL    EKG None  Radiology No  results found.  Procedures Procedures    Medications Ordered in ED Medications  EPINEPHrine (EPI-PEN) injection 0.3 mg (0.3 mg Intramuscular Given by Other 06/17/21 1205)  sodium chloride 0.9 % bolus 1,000 mL (0 mLs Intravenous Stopped 06/17/21 1305)  methylPREDNISolone sodium succinate (SOLU-MEDROL) 125 mg/2 mL injection 125 mg (125 mg Intravenous Given 06/17/21 1204)  famotidine (PEPCID) IVPB 20 mg premix (0 mg Intravenous Stopped 06/17/21 1237)  diphenhydrAMINE (BENADRYL) injection 50 mg (50 mg Intravenous Given by Other 06/17/21 1205)    ED Course/ Medical Decision Making/ A&P                           Medical Decision Making Amount and/or Complexity of Data Reviewed Labs: ordered.  Risk Prescription drug management.   Review of records shows visit June 08, 2020 for laceration.  Cardiac monitoring shows sinus rhythm.  History obtained from family at bedside.  Diagnosis does include labs unremarkable white count normal chemistry normal.  Patient given liter bolus of fluids given epi Solu-Medrol and Benadryl with significant improvement and resolution of symptoms.  Monitored for several hours no additional adverse events, discharged home in stable condition, advised outpatient follow-up with his doctor in 2 to 3 days advised immediate return for worsening or recurrent symptoms.        Final Clinical Impression(s) / ED Diagnoses Final diagnoses:  Anaphylaxis, initial encounter    Rx / DC Orders ED Discharge Orders          Ordered    fexofenadine (ALLEGRA) 60 MG tablet  2 times daily        06/17/21 1455    predniSONE (DELTASONE) 20 MG tablet  2 times daily with meals        06/17/21 1455    EPINEPHrine 0.3 mg/0.3 mL IJ SOAJ injection  As needed        06/17/21 1455              Cheryll Cockayne, MD 06/17/21 1455

## 2021-06-17 NOTE — Discharge Instructions (Addendum)
Call your primary care doctor or specialist as discussed in the next 2-3 days.   Return immediately back to the ER if:  Your symptoms worsen within the next 12-24 hours. You develop new symptoms such as new fevers, persistent vomiting, new pain, shortness of breath, or new weakness or numbness, or if you have any other concerns.
